# Patient Record
Sex: Female | Born: 1990 | Race: Black or African American | Hispanic: No | Marital: Single | Smoking: Current some day smoker
Health system: Southern US, Community
[De-identification: ages and names within clinical notes are randomized; demographics above are authoritative.]

## PROBLEM LIST (undated history)

## (undated) DIAGNOSIS — L309 Dermatitis, unspecified: Secondary | ICD-10-CM

## (undated) DIAGNOSIS — T7840XA Allergy, unspecified, initial encounter: Secondary | ICD-10-CM

## (undated) DIAGNOSIS — J45909 Unspecified asthma, uncomplicated: Secondary | ICD-10-CM

## (undated) HISTORY — PX: WISDOM TOOTH EXTRACTION: SHX21

## (undated) HISTORY — PX: MOUTH SURGERY: SHX715

## (undated) HISTORY — DX: Allergy, unspecified, initial encounter: T78.40XA

---
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
## 2009-01-04 ENCOUNTER — Emergency Department (HOSPITAL_COMMUNITY): Admission: EM | Admit: 2009-01-04 | Discharge: 2009-01-04 | Payer: Self-pay | Admitting: Emergency Medicine

## 2010-05-22 LAB — CULTURE, ROUTINE-ABSCESS: Gram Stain: NONE SEEN

## 2010-12-15 ENCOUNTER — Inpatient Hospital Stay (INDEPENDENT_AMBULATORY_CARE_PROVIDER_SITE_OTHER)
Admission: RE | Admit: 2010-12-15 | Discharge: 2010-12-15 | Disposition: A | Discharge status: Home / Self Care | Payer: Self-pay | Source: Ambulatory Visit | Attending: Emergency Medicine | Admitting: Emergency Medicine

## 2010-12-15 DIAGNOSIS — T7840XA Allergy, unspecified, initial encounter: Secondary | ICD-10-CM

## 2011-10-28 ENCOUNTER — Encounter (HOSPITAL_COMMUNITY): Payer: Self-pay | Admitting: *Deleted

## 2011-10-28 ENCOUNTER — Emergency Department (INDEPENDENT_AMBULATORY_CARE_PROVIDER_SITE_OTHER)
Admission: EM | Admit: 2011-10-28 | Discharge: 2011-10-28 | Disposition: A | Discharge status: Home / Self Care | Payer: Self-pay | Source: Home / Self Care | Attending: Emergency Medicine | Admitting: Emergency Medicine

## 2011-10-28 DIAGNOSIS — L0291 Cutaneous abscess, unspecified: Secondary | ICD-10-CM

## 2011-10-28 MED ORDER — TRAMADOL HCL 50 MG PO TABS: 100.0000 mg | ORAL_TABLET | Freq: Three times a day (TID) | ORAL | Status: AC | PRN

## 2011-10-28 MED ORDER — DOXYCYCLINE HYCLATE 100 MG PO TABS: 100.0000 mg | ORAL_TABLET | Freq: Two times a day (BID) | ORAL | Status: AC

## 2011-10-28 NOTE — ED Provider Notes (Signed)
Chief Complaint  Patient presents with  . Recurrent Skin Infections    History of Present Illness:    The patient is a 21 year old female with a two-day history of an abscess on her left buttock. This has not been draining any pus. She's had sweats but no chills or fever. She had an abscess to 3 years ago on her right posterior thigh which was positive for MRSA. She has not had any abscesses since then. No history of diabetes. 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  Review of Systems:  Other than noted above, the patient denies any of the following symptoms: Systemic:  No fever, chills or sweats. Skin:  No rash or itching.  PMFSH:  Past medical history, family history, social history, meds, and allergies were reviewed.  No history of diabetes or prior history of abscesses or MRSA.  Physical Exam:   Vital signs:  BP 123/77  Pulse 81  Temp 99.8 F (37.7 C) (Oral)  Resp 19  SpO2 100%  LMP 10/09/2011 Skin:  There is a 1 cm, fluctuant, tender area on the left buttock, in the intergluteal cleft, superior to the anus.  Skin exam was otherwise normal.  No rash. Ext:  Distal pulses were full, patient has full ROM of all joints.    Procedure:  Verbal informed consent was obtained.  The patient was informed of the risks and benefits of the procedure and understands and accepts.  Identity of the patient was verified verbally and by wristband.   The abscess area described above was prepped with Betadine and alcohol and anesthetized with 5 mL of 2% Xylocaine with epinephrine.  Using a #11 scalpel blade, a singe straight incision was made into the area of fluctulence, yielding a large amount of prurulent drainage.  Routine cultures were obtained.  Blunt dissection was used to break up loculations and the resulting wound cavity was packed with 1/4 inch Iodoform gauze.  A sterile pressure dressing was applied.  Assessment:  The encounter diagnosis was Abscess.  Plan:   1.  The  following meds were prescribed:   New Prescriptions   DOXYCYCLINE (VIBRA-TABS) 100 MG TABLET    Take 1 tablet (100 mg total) by mouth 2 (two) times daily.   TRAMADOL (ULTRAM) 50 MG TABLET    Take 2 tablets (100 mg total) by mouth every 8 (eight) hours as needed for pain.   2.  The patient was instructed in symptomatic care and handouts were given. 3.  The patient was instructed to leave the dressing in place and return again in 48 hours for packing removal.   Reuben Likes, MD 10/28/11 2222

## 2011-10-28 NOTE — ED Notes (Signed)
Pt  Reports  Symptoms  Of a  Boil      On  Middle  Of  Buttock  Area  For  Several days    She           Has  History  Of  excema       In  Past  She  Reports   She  Is  Allergic  To  Peanuts  And  Sulfa

## 2011-11-01 LAB — CULTURE, ROUTINE-ABSCESS: Culture: NO GROWTH

## 2012-01-23 ENCOUNTER — Encounter (HOSPITAL_COMMUNITY): Payer: Self-pay | Admitting: Emergency Medicine

## 2012-01-23 ENCOUNTER — Emergency Department (INDEPENDENT_AMBULATORY_CARE_PROVIDER_SITE_OTHER)
Admission: EM | Admit: 2012-01-23 | Discharge: 2012-01-23 | Disposition: A | Discharge status: Home / Self Care | Payer: Self-pay | Source: Home / Self Care | Attending: Emergency Medicine | Admitting: Emergency Medicine

## 2012-01-23 DIAGNOSIS — B340 Adenovirus infection, unspecified: Secondary | ICD-10-CM

## 2012-01-23 DIAGNOSIS — J45909 Unspecified asthma, uncomplicated: Secondary | ICD-10-CM

## 2012-01-23 DIAGNOSIS — L309 Dermatitis, unspecified: Secondary | ICD-10-CM

## 2012-01-23 DIAGNOSIS — L259 Unspecified contact dermatitis, unspecified cause: Secondary | ICD-10-CM

## 2012-01-23 LAB — POCT RAPID STREP A: Streptococcus, Group A Screen (Direct): NEGATIVE

## 2012-01-23 MED ORDER — POLYETHYL GLYCOL-PROPYL GLYCOL 0.4-0.3 % OP SOLN: OPHTHALMIC | Status: DC

## 2012-01-23 MED ORDER — FLUOCINONIDE 0.05 % EX OINT: TOPICAL_OINTMENT | Freq: Two times a day (BID) | CUTANEOUS | Status: DC

## 2012-01-23 MED ORDER — NAPROXEN 500 MG PO TABS: 500.0000 mg | ORAL_TABLET | Freq: Two times a day (BID) | ORAL | Status: DC

## 2012-01-23 MED ORDER — BENZONATATE 200 MG PO CAPS: 200.0000 mg | ORAL_CAPSULE | Freq: Three times a day (TID) | ORAL | Status: DC | PRN

## 2012-01-23 MED ORDER — ALBUTEROL SULFATE HFA 108 (90 BASE) MCG/ACT IN AERS: 1.0000 | INHALATION_SPRAY | Freq: Four times a day (QID) | RESPIRATORY_TRACT | Status: DC | PRN

## 2012-01-23 NOTE — ED Notes (Signed)
Pt c/o cold sx x6 days... Sx include: fever, sore throat, nasal congestion, cough w/yellow sputum, and poss pink eyes... Has tried dayquil/nyquil to alleviate the discomfort... Denies: nauseas, vomiting, diarrhea... She is alert w/no signs of acute distress.

## 2012-01-24 NOTE — ED Provider Notes (Signed)
Chief Complaint  Patient presents with  . URI    History of Present Illness:   The patient is a 21 year old female who has had a one-week history of nasal congestion, head pressure, ear congestion, redness of both eyes with crusting and aching. A nosebleed on the left side, sore throat, cough productive yellow sputum, swollen glands, and she had some fever and chills at the onset of the symptoms. She thought her eyebrows with yellow today and was concerned about that. She has a history of asthma and uses albuterol on an as-needed basis.  Review of Systems:  Other than noted above, the patient denies any of the following symptoms. Systemic:  No fever, chills, sweats, fatigue, myalgias, headache, or anorexia. Eye:  No redness, pain or drainage. ENT:  No earache, ear congestion, nasal congestion, sneezing, rhinorrhea, sinus pressure, sinus pain, post nasal drip, or sore throat. Lungs:  No cough, sputum production, wheezing, shortness of breath, or chest pain. GI:  No abdominal pain, nausea, vomiting, or diarrhea.  PMFSH:  Past medical history, family history, social history, meds, and allergies were reviewed.  Physical Exam:   Vital signs:  BP 128/80  Pulse 69  Temp 98.5 F (36.9 C) (Oral)  Resp 16  SpO2 99%  LMP 01/02/2012 General:  Alert, in no distress. Eye:  Conjunctiva is were injected without any drainage or crusting. Lids were normal. There is no scleral icterus. ENT:  TMs and canals were normal, without erythema or inflammation.  Nasal mucosa was clear and uncongested, without drainage.  Mucous membranes were moist.  Pharynx was clear, without exudate or drainage.  There were no oral ulcerations or lesions. Neck:  Supple, no adenopathy, tenderness or mass. Lungs:  No respiratory distress.  Lungs were clear to auscultation, without wheezes, rales or rhonchi.  Breath sounds were clear and equal bilaterally.  Heart:  Regular rhythm, without gallops, murmers or rubs. Skin:  Clear,  warm, and dry, with extensive eczematous rash.  Labs:   Results for orders placed during the hospital encounter of 01/23/12  POCT RAPID STREP A ([facility] ONLY)      Component Value Range   Streptococcus, Group A Screen (Direct) NEGATIVE  NEGATIVE   Assessment:  The primary encounter diagnosis was Adenovirus infection. Diagnoses of Asthma and Eczema were also pertinent to this visit.  Plan:   1.  The following meds were prescribed:   New Prescriptions   ALBUTEROL (PROVENTIL HFA;VENTOLIN HFA) 108 (90 BASE) MCG/ACT INHALER    Inhale 1-2 puffs into the lungs every 6 (six) hours as needed for wheezing.   BENZONATATE (TESSALON) 200 MG CAPSULE    Take 1 capsule (200 mg total) by mouth 3 (three) times daily as needed for cough.   FLUOCINONIDE OINTMENT (LIDEX) 0.05 %    Apply topically 2 (two) times daily.   NAPROXEN (NAPROSYN) 500 MG TABLET    Take 1 tablet (500 mg total) by mouth 2 (two) times daily.   POLYETHYL GLYCOL-PROPYL GLYCOL (SYSTANE) 0.4-0.3 % SOLN    1 drop in each eye every 3 hours.   2.  The patient was instructed in symptomatic care and handouts were given. 3.  The patient was told to return if becoming worse in any way, if no better in 3 or 4 days, and given some red flag symptoms that would indicate earlier return.   Reuben Likes, MD 01/24/12 Marlyne Beards

## 2012-08-24 ENCOUNTER — Emergency Department (HOSPITAL_COMMUNITY)
Admission: EM | Admit: 2012-08-24 | Discharge: 2012-08-24 | Disposition: A | Discharge status: Home / Self Care | Payer: Self-pay | Attending: Emergency Medicine | Admitting: Emergency Medicine

## 2012-08-24 ENCOUNTER — Encounter (HOSPITAL_COMMUNITY): Payer: Self-pay | Admitting: Emergency Medicine

## 2012-08-24 DIAGNOSIS — Z872 Personal history of diseases of the skin and subcutaneous tissue: Secondary | ICD-10-CM | POA: Insufficient documentation

## 2012-08-24 DIAGNOSIS — R05 Cough: Secondary | ICD-10-CM | POA: Insufficient documentation

## 2012-08-24 DIAGNOSIS — R21 Rash and other nonspecific skin eruption: Secondary | ICD-10-CM | POA: Insufficient documentation

## 2012-08-24 DIAGNOSIS — R0602 Shortness of breath: Secondary | ICD-10-CM | POA: Insufficient documentation

## 2012-08-24 DIAGNOSIS — J45901 Unspecified asthma with (acute) exacerbation: Secondary | ICD-10-CM | POA: Insufficient documentation

## 2012-08-24 DIAGNOSIS — R062 Wheezing: Secondary | ICD-10-CM | POA: Insufficient documentation

## 2012-08-24 DIAGNOSIS — Z79899 Other long term (current) drug therapy: Secondary | ICD-10-CM | POA: Insufficient documentation

## 2012-08-24 DIAGNOSIS — R059 Cough, unspecified: Secondary | ICD-10-CM | POA: Insufficient documentation

## 2012-08-24 HISTORY — DX: Unspecified asthma, uncomplicated: J45.909

## 2012-08-24 HISTORY — DX: Dermatitis, unspecified: L30.9

## 2012-08-24 MED ORDER — AEROCHAMBER PLUS W/MASK MISC
1.0000 | Freq: Once | Status: AC
  Administered 2012-08-24: 1
  Filled 2012-08-24: qty 1

## 2012-08-24 MED ORDER — ALBUTEROL SULFATE (5 MG/ML) 0.5% IN NEBU
5.0000 mg | INHALATION_SOLUTION | Freq: Once | RESPIRATORY_TRACT | Status: AC
  Administered 2012-08-24: 5 mg via RESPIRATORY_TRACT
  Filled 2012-08-24: qty 1

## 2012-08-24 MED ORDER — ALBUTEROL SULFATE HFA 108 (90 BASE) MCG/ACT IN AERS
2.0000 | INHALATION_SPRAY | RESPIRATORY_TRACT | Status: DC | PRN
  Administered 2012-08-24: 2 via RESPIRATORY_TRACT
  Filled 2012-08-24: qty 6.7

## 2012-08-24 NOTE — ED Notes (Signed)
PT. REPORTS ASTHMA ATTACK ONSET LAST NIGHT WITH OCCASIONAL PRODUCTIVE COUGH , PT. RAN OUT OF HER MDI AT HOME LAST NIGHT.

## 2012-08-24 NOTE — ED Provider Notes (Signed)
History    CSN: [id number] Arrival date & time 08/24/12  4098  First MD Initiated Contact with Patient 08/24/12 2111     Chief Complaint  Patient presents with  . Asthma   (Consider location/radiation/quality/duration/timing/severity/associated sxs/prior Treatment) HPI  Patient developed asthma attack last night with wheezing and cough. Treated with NyQuil with partial relief. She reports the cough is productive but "I didn't look at it". She denies fever.nothing makes symptoms better or worse. No other associated symptoms. She is presently asymptomatic since treatment with one nebulized treatment here. Past Medical History  Diagnosis Date  . Asthma   . Eczema    Past Surgical History  Procedure Laterality Date  . Mouth surgery     No family history on file. History  Substance Use Topics  . Smoking status: Never Smoker   . Smokeless tobacco: Not on file  . Alcohol Use: Yes   OB History   Grav Para Term Preterm Abortions TAB SAB Ect Mult Living                 Review of Systems  Constitutional: Negative.   HENT: Negative.   Respiratory: Positive for cough, shortness of breath and wheezing.   Cardiovascular: Negative.   Gastrointestinal: Negative.   Musculoskeletal: Negative.   Skin: Positive for rash.  Neurological: Negative.   Psychiatric/Behavioral: Negative.   All other systems reviewed and are negative.    Allergies  Peanuts and Sulfa antibiotics  Home Medications   Current Outpatient Rx  Name  Route  Sig  Dispense  Refill  . albuterol (PROVENTIL HFA;VENTOLIN HFA) 108 (90 BASE) MCG/ACT inhaler   Inhalation   Inhale 1-2 puffs into the lungs every 6 (six) hours as needed for wheezing.   1 Inhaler   12   . fluocinonide ointment (LIDEX) 0.05 %   Topical   Apply topically 2 (two) times daily.   60 g   12   . hydrocortisone 1 % ointment   Topical   Apply 1 application topically 2 (two) times daily.         . Pseudoeph-Doxylamine-DM-APAP (NYQUIL  PO)   Oral   Take 30 mLs by mouth at bedtime as needed (cold).          BP 115/88  Pulse 92  Temp(Src) 97.6 F (36.4 C) (Oral)  Resp 14  SpO2 100%  LMP 08/18/2012 Physical Exam  Nursing note and vitals reviewed. Constitutional: She appears well-developed and well-nourished.  HENT:  Head: Normocephalic and atraumatic.  Eyes: Conjunctivae are normal. Pupils are equal, round, and reactive to light.  Neck: Neck supple. No tracheal deviation present. No thyromegaly present.  Cardiovascular: Normal rate and regular rhythm.   No murmur heard. Pulmonary/Chest: Effort normal and breath sounds normal. No respiratory distress.  Speaks in paragraphs  Abdominal: Soft. Bowel sounds are normal. She exhibits no distension. There is no tenderness.  Musculoskeletal: Normal range of motion. She exhibits no edema and no tenderness.  Neurological: She is alert. Coordination normal.  Skin: Skin is warm and dry. Rash noted.  Grayish patchy chronic-appearing rash on neck trunk and extremities, suggestive of chronic eczema  Psychiatric: She has a normal mood and affect.    ED Course  Procedures (including critical care time) Labs Reviewed - No data to display No results found. 1. Asthma exacerbation, mild     MDM  Assessment: Patient is a mild asthmatic. She has used steroids in the past. Has had only one ED visit in the  past 12 months for asthma. No hospitalizations since early childhood for asthma. Plan albuterol inhaler with spacer to go he is 2 puffs every 4 hours when necessary shortness of breath return if new more than every 4 hours. Referral [facility] Diagnosis exacerbation of asthma  Doug Sou, MD 08/24/12 2137

## 2012-09-23 ENCOUNTER — Emergency Department (HOSPITAL_COMMUNITY)
Admission: EM | Admit: 2012-09-23 | Discharge: 2012-09-23 | Disposition: A | Discharge status: Home / Self Care | Payer: Self-pay | Attending: Emergency Medicine | Admitting: Emergency Medicine

## 2012-09-23 ENCOUNTER — Encounter (HOSPITAL_COMMUNITY): Payer: Self-pay | Admitting: Emergency Medicine

## 2012-09-23 DIAGNOSIS — Z79899 Other long term (current) drug therapy: Secondary | ICD-10-CM | POA: Insufficient documentation

## 2012-09-23 DIAGNOSIS — L309 Dermatitis, unspecified: Secondary | ICD-10-CM

## 2012-09-23 DIAGNOSIS — R21 Rash and other nonspecific skin eruption: Secondary | ICD-10-CM | POA: Insufficient documentation

## 2012-09-23 DIAGNOSIS — J45909 Unspecified asthma, uncomplicated: Secondary | ICD-10-CM | POA: Insufficient documentation

## 2012-09-23 DIAGNOSIS — L259 Unspecified contact dermatitis, unspecified cause: Secondary | ICD-10-CM | POA: Insufficient documentation

## 2012-09-23 DIAGNOSIS — L299 Pruritus, unspecified: Secondary | ICD-10-CM | POA: Insufficient documentation

## 2012-09-23 MED ORDER — FLUOCINONIDE-E 0.05 % EX CREA: TOPICAL_CREAM | Freq: Two times a day (BID) | CUTANEOUS | Status: DC

## 2012-09-23 MED ORDER — FLUOCINONIDE 0.05 % EX CREA: TOPICAL_CREAM | Freq: Two times a day (BID) | CUTANEOUS | Status: DC

## 2012-09-23 NOTE — ED Provider Notes (Signed)
  CSN: [id number]     Arrival date & time 09/23/12  1057 History     First MD Initiated Contact with Patient 09/23/12 1224     Chief Complaint  Patient presents with  . Rash   (Consider location/radiation/quality/duration/timing/severity/associated sxs/prior Treatment) HPI Comments: Patient with a history of Eczema presents with a chief complaint of rash.  Rash is located on both arms, both legs, and neck.  Rash has been present for months, but has been worsening.  She has been on Lidex in the past for her Eczema, which she reports has helped.  However, she has been off of the medication for the past 5 months.  She currently does not have a PCP.  Rash is scaly and does itch. Rash not painful.  She has not been putting anything on it.  She denies any shortness of breath or swelling of the lips, tongue, or throat.  No fever or chills.  The history is provided by the patient.    Past Medical History  Diagnosis Date  . Asthma   . Eczema    Past Surgical History  Procedure Laterality Date  . Mouth surgery     No family history on file. History  Substance Use Topics  . Smoking status: Never Smoker   . Smokeless tobacco: Not on file  . Alcohol Use: Yes   OB History   Grav Para Term Preterm Abortions TAB SAB Ect Mult Living                 Review of Systems  Skin: Positive for rash.  All other systems reviewed and are negative.    Allergies  Peanuts and Sulfa antibiotics  Home Medications   Current Outpatient Rx  Name  Route  Sig  Dispense  Refill  . albuterol (PROVENTIL HFA;VENTOLIN HFA) 108 (90 BASE) MCG/ACT inhaler   Inhalation   Inhale 2 puffs into the lungs every 6 (six) hours as needed for wheezing.          BP 146/87  Pulse 108  Temp(Src) 98 F (36.7 C)  Resp 16  SpO2 99%  LMP 08/18/2012 Physical Exam  Nursing note and vitals reviewed. Constitutional: She appears well-developed and well-nourished.  HENT:  Head: Normocephalic and atraumatic.   Mouth/Throat: Oropharynx is clear and moist.  No swelling of the lips, tongue, or throat.  Cardiovascular: Normal rate, regular rhythm and normal heart sounds.   Pulmonary/Chest: Effort normal and breath sounds normal. She has no wheezes.  Neurological: She is alert.  Skin: Skin is warm and dry.  Excoriated scaly rash of both arms, both legs, and neck.    Psychiatric: She has a normal mood and affect.    ED Course   Procedures (including critical care time)  Labs Reviewed - No data to display No results found. No diagnosis found.  MDM  Patient with a history of Eczema presents with a rash of both arms and neck.  Rash consistent with Eczema.  Patient has been off of her Eczema medications for the past 4 months.  Patient given the Lidex that she has been on in the past.  Patient given Lidex and instructed to follow up with Dermatology.  Pascal Lux Saxman, PA-C 09/24/12 [phone]

## 2012-09-23 NOTE — ED Notes (Addendum)
All over eczema that has gotten worse all over itching

## 2012-09-29 NOTE — ED Provider Notes (Signed)
Medical screening examination/treatment/procedure(s) were performed by non-physician practitioner and as supervising physician I was immediately available for consultation/collaboration.   Charles B. Bernette Mayers, MD 09/29/12 1115

## 2013-02-03 ENCOUNTER — Emergency Department (HOSPITAL_COMMUNITY)
Admission: EM | Admit: 2013-02-03 | Discharge: 2013-02-03 | Disposition: A | Discharge status: Home / Self Care | Payer: Self-pay | Attending: Emergency Medicine | Admitting: Emergency Medicine

## 2013-02-03 ENCOUNTER — Encounter (HOSPITAL_COMMUNITY): Payer: Self-pay | Admitting: Emergency Medicine

## 2013-02-03 DIAGNOSIS — IMO0002 Reserved for concepts with insufficient information to code with codable children: Secondary | ICD-10-CM | POA: Insufficient documentation

## 2013-02-03 DIAGNOSIS — J45901 Unspecified asthma with (acute) exacerbation: Secondary | ICD-10-CM | POA: Insufficient documentation

## 2013-02-03 DIAGNOSIS — Z872 Personal history of diseases of the skin and subcutaneous tissue: Secondary | ICD-10-CM | POA: Insufficient documentation

## 2013-02-03 DIAGNOSIS — Z79899 Other long term (current) drug therapy: Secondary | ICD-10-CM | POA: Insufficient documentation

## 2013-02-03 MED ORDER — ALBUTEROL SULFATE HFA 108 (90 BASE) MCG/ACT IN AERS
2.0000 | INHALATION_SPRAY | Freq: Once | RESPIRATORY_TRACT | Status: AC
  Administered 2013-02-03: 2 via RESPIRATORY_TRACT
  Filled 2013-02-03: qty 6.7

## 2013-02-03 MED ORDER — PREDNISONE 20 MG PO TABS: 40.0000 mg | ORAL_TABLET | Freq: Every day | ORAL | Status: DC

## 2013-02-03 MED ORDER — PREDNISONE 20 MG PO TABS
60.0000 mg | ORAL_TABLET | Freq: Once | ORAL | Status: AC
  Administered 2013-02-03: 60 mg via ORAL
  Filled 2013-02-03: qty 3

## 2013-02-03 MED ORDER — IPRATROPIUM BROMIDE 0.02 % IN SOLN: 0.5000 mg | Freq: Once | RESPIRATORY_TRACT | Status: DC

## 2013-02-03 MED ORDER — ALBUTEROL SULFATE (5 MG/ML) 0.5% IN NEBU: 2.5000 mg | INHALATION_SOLUTION | Freq: Once | RESPIRATORY_TRACT | Status: DC

## 2013-02-03 MED ORDER — FLUOCINONIDE 0.05 % EX CREA: TOPICAL_CREAM | Freq: Two times a day (BID) | CUTANEOUS | Status: DC

## 2013-02-03 NOTE — ED Provider Notes (Signed)
CSN: [id number]     Arrival date & time 02/03/13  0447 History   First MD Initiated Contact with Patient 02/03/13 0459     Chief Complaint  Patient presents with  . Asthma   (Consider location/radiation/quality/duration/timing/severity/associated sxs/prior Treatment) HPI Comments: Patient is a 22 year old female with a history of asthma and asked him who presents this evening for shortness of breath. Patient states that shortness of breath has been intermittent gradually worsening over the last 2 days. Patient states that she usually gets relief from her albuterol inhaler but she has been out of this for the last 2 days. Symptoms worsen with prolonged exertion and mildly improved with rest and when chewing mint gum. Patient endorses sporadic dry cough associated with symptoms. She denies associated fever, lightheadedness, dizziness, loss of consciousness, hemoptysis, chest pain, nausea or vomiting, numbness/tingling, and weakness. Patient endorses a history of hospitalizations secondary to asthma, but "not for many years". She denies history of intubations secondary to asthma.  Patient is a 22 y.o. female presenting with asthma. The history is provided by the patient. No language interpreter was used.  Asthma Associated symptoms include coughing.    Past Medical History  Diagnosis Date  . Asthma   . Eczema    Past Surgical History  Procedure Laterality Date  . Mouth surgery     No family history on file. History  Substance Use Topics  . Smoking status: Never Smoker   . Smokeless tobacco: Not on file  . Alcohol Use: Yes   OB History   Grav Para Term Preterm Abortions TAB SAB Ect Mult Living                 Review of Systems  Respiratory: Positive for cough and shortness of breath.   All other systems reviewed and are negative.    Allergies  Peanuts and Sulfa antibiotics  Home Medications   Current Outpatient Rx  Name  Route  Sig  Dispense  Refill  . albuterol  (PROVENTIL HFA;VENTOLIN HFA) 108 (90 BASE) MCG/ACT inhaler   Inhalation   Inhale 2 puffs into the lungs every 6 (six) hours as needed for wheezing.         . Pseudoephedrine-DM-GG-APAP (TYLENOL COLD SEVERE CONGESTION PO)   Oral   Take 1 capsule by mouth every 6 (six) hours as needed.         . fluocinonide cream (LIDEX) 0.05 %   Topical   Apply topically 2 (two) times daily.   30 g   0   . predniSONE (DELTASONE) 20 MG tablet   Oral   Take 2 tablets (40 mg total) by mouth daily.   10 tablet   0    BP 144/84  Pulse 101  Temp(Src) 98.1 F (36.7 C) (Oral)  Resp 18  Ht 5\' 6"  (1.676 m)  Wt 160 lb (72.576 kg)  BMI 25.84 kg/m2  SpO2 96%  LMP 01/18/2013  Physical Exam  Nursing note and vitals reviewed. Constitutional: She is oriented to person, place, and time. She appears well-developed and well-nourished. No distress.  HENT:  Head: Normocephalic and atraumatic.  Mouth/Throat: Oropharynx is clear and moist. No oropharyngeal exudate.  Eyes: Conjunctivae and EOM are normal. Pupils are equal, round, and reactive to light. No scleral icterus.  Neck: Normal range of motion. Neck supple.  Cardiovascular: Normal rate, regular rhythm, normal heart sounds and intact distal pulses.   HR 93bpm  Pulmonary/Chest: Effort normal and breath sounds normal. No stridor. No respiratory distress.  She has no wheezes. She has no rales.  No retractions or accessory muscle use; symmetric chest expansion. Patient in no acute respiratory distress.  Abdominal: Soft. She exhibits no distension. There is no tenderness.  Musculoskeletal: Normal range of motion.  Neurological: She is alert and oriented to person, place, and time.  Skin: Skin is warm and dry. She is not diaphoretic. No erythema. No pallor.  Eczematous rash on b/l extremities, neck, and face. Pruritic. Non-weeping.  Psychiatric: She has a normal mood and affect. Her behavior is normal.    ED Course  Procedures (including critical care  time) Labs Review Labs Reviewed - No data to display Imaging Review No results found.  EKG Interpretation   None       MDM   1. Asthma exacerbation, mild    Uncomplicated asthma exacerbation. Patient with history of asthma and states she has been out of her inhaler and, therefore, unable to manage symptoms at home. Lungs clear to auscultation bilaterally in patient without retractions or accessory muscle use on physical exam. Heart regular rate and rhythm. Patient treated in ED with albuterol and Atrovent nebulizer as well as oral prednisone with improvement in symptoms. Patient endorses significant improvement and ambulates without hypoxia in ED. She is stable for discharge with albuterol inhaler and five-day course of prednisone. Return precautions discussed and patient agreeable to plan with no unaddressed concerns.   Filed Vitals:   02/03/13 0447 02/03/13 0458 02/03/13 0558 02/03/13 0616  BP:  144/84  136/82  Pulse:  101  77  Temp:  98.1 F (36.7 C)    TempSrc:  Oral    Resp:  18  18  Height:  5\' 6"  (1.676 m)    Weight:  160 lb (72.576 kg)    SpO2: 96% 100% 96% 100%     Antony Madura, PA-C 02/12/13 1933

## 2013-02-03 NOTE — ED Notes (Signed)
Bed: AV40 Expected date:  Expected time:  Means of arrival:  Comments: EMS asthma, resp distress

## 2013-02-03 NOTE — ED Notes (Signed)
Pt verbalized understanding of MDI and d/c instructions

## 2013-02-03 NOTE — ED Notes (Signed)
Pt arrived via EMS. Pt trasported from home with asthma exacerbation. Pt out of MDI x 2days. 1 albuterol neb tx given enroute.

## 2013-02-04 NOTE — ED Provider Notes (Signed)
Medical screening examination/treatment/procedure(s) were performed by non-physician practitioner and as supervising physician I was immediately available for consultation/collaboration.  Arilyn Brierley M Sherice Ijames, MD 02/04/13 2049 

## 2013-02-13 NOTE — ED Provider Notes (Signed)
Medical screening examination/treatment/procedure(s) were performed by non-physician practitioner and as supervising physician I was immediately available for consultation/collaboration.   Hurman Horn, MD 02/13/13 [phone]

## 2013-03-09 ENCOUNTER — Encounter (HOSPITAL_COMMUNITY): Payer: Self-pay | Admitting: Emergency Medicine

## 2013-03-09 ENCOUNTER — Emergency Department (HOSPITAL_COMMUNITY)
Admission: EM | Admit: 2013-03-09 | Discharge: 2013-03-09 | Disposition: A | Discharge status: Home / Self Care | Payer: Self-pay | Attending: Emergency Medicine | Admitting: Emergency Medicine

## 2013-03-09 DIAGNOSIS — L259 Unspecified contact dermatitis, unspecified cause: Secondary | ICD-10-CM | POA: Insufficient documentation

## 2013-03-09 DIAGNOSIS — J45901 Unspecified asthma with (acute) exacerbation: Secondary | ICD-10-CM | POA: Insufficient documentation

## 2013-03-09 DIAGNOSIS — Z79899 Other long term (current) drug therapy: Secondary | ICD-10-CM | POA: Insufficient documentation

## 2013-03-09 DIAGNOSIS — J45909 Unspecified asthma, uncomplicated: Secondary | ICD-10-CM

## 2013-03-09 MED ORDER — ALBUTEROL SULFATE (2.5 MG/3ML) 0.083% IN NEBU
5.0000 mg | INHALATION_SOLUTION | Freq: Once | RESPIRATORY_TRACT | Status: AC
  Administered 2013-03-09: 5 mg via RESPIRATORY_TRACT
  Filled 2013-03-09: qty 6

## 2013-03-09 MED ORDER — ALBUTEROL SULFATE HFA 108 (90 BASE) MCG/ACT IN AERS
2.0000 | INHALATION_SPRAY | Freq: Once | RESPIRATORY_TRACT | Status: AC
  Administered 2013-03-09: 2 via RESPIRATORY_TRACT
  Filled 2013-03-09: qty 6.7

## 2013-03-09 MED ORDER — ALBUTEROL SULFATE HFA 108 (90 BASE) MCG/ACT IN AERS: 1.0000 | INHALATION_SPRAY | RESPIRATORY_TRACT | Status: DC | PRN

## 2013-03-09 NOTE — ED Notes (Signed)
Pt. reports asthma attack this evening , pt.stated she ran out of her MDI at home . Denies fever or chills.

## 2013-03-09 NOTE — Discharge Instructions (Signed)
Asthma, Adult °Asthma is a recurring condition in which the airways tighten and narrow. Asthma can make it difficult to breathe. It can cause coughing, wheezing, and shortness of breath. Asthma episodes (also called asthma attacks) range from minor to life-threatening. Asthma cannot be cured, but medicines and lifestyle changes can help control it. °CAUSES °Asthma is believed to be caused by inherited (genetic) and environmental factors, but its exact cause is unknown. Asthma may be triggered by allergens, lung infections, or irritants in the air. Asthma triggers are different for each person. Common triggers include:  °· Animal dander. °· Dust mites. °· Cockroaches. °· Pollen from trees or grass. °· Mold. °· Smoke. °· Air pollutants such as dust, household cleaners, hair sprays, aerosol sprays, paint fumes, strong chemicals, or strong odors. °· Cold air, weather changes, and winds (which increase molds and pollens in the air). °· Strong emotional expressions such as crying or laughing hard. °· Stress. °· Certain medicines (such as aspirin) or types of drugs (such as beta-blockers). °· Sulfites in foods and drinks. Foods and drinks that may contain sulfites include dried fruit, potato chips, and sparkling grape juice. °· Infections or inflammatory conditions such as the flu, a cold, or an inflammation of the nasal membranes (rhinitis). °· Gastroesophageal reflux disease (GERD). °· Exercise or strenuous activity. °SYMPTOMS °Symptoms may occur immediately after asthma is triggered or many hours later. Symptoms include: °· Wheezing. °· Excessive nighttime or early morning coughing. °· Frequent or severe coughing with a common cold. °· Chest tightness. °· Shortness of breath. °DIAGNOSIS  °The diagnosis of asthma is made by a review of your medical history and a physical exam. Tests may also be performed. These may include: °· Lung function studies. These tests show how much air you breath in and out. °· Allergy  tests. °· Imaging tests such as X-rays. °TREATMENT  °Asthma cannot be cured, but it can usually be controlled. Treatment involves identifying and avoiding your asthma triggers. It also involves medicines. There are 2 classes of medicine used for asthma treatment:  °· Controller medicines. These prevent asthma symptoms from occurring. They are usually taken every day. °· Reliever or rescue medicines. These quickly relieve asthma symptoms. They are used as needed and provide short-term relief. °Your health care provider will help you create an asthma action plan. An asthma action plan is a written plan for managing and treating your asthma attacks. It includes a list of your asthma triggers and how they may be avoided. It also includes information on when medicines should be taken and when their dosage should be changed. An action plan may also involve the use of a device called a peak flow meter. A peak flow meter measures how well the lungs are working. It helps you monitor your condition. °HOME CARE INSTRUCTIONS  °· Take medicine as directed by your health care provider. Speak with your health care provider if you have questions about how or when to take the medicines. °· Use a peak flow meter as directed by your health care provider. Record and keep track of readings. °· Understand and use the action plan to help minimize or stop an asthma attack without needing to seek medical care. °· Control your home environment in the following ways to help prevent asthma attacks: °· Do not smoke. Avoid being exposed to secondhand smoke. °· Change your heating and air conditioning filter regularly. °· Limit your use of fireplaces and wood stoves. °· Get rid of pests (such as roaches and   mice) and their droppings. °· Throw away plants if you see mold on them. °· Clean your floors and dust regularly. Use unscented cleaning products. °· Try to have someone else vacuum for you regularly. Stay out of rooms while they are being  vacuumed and for a short while afterward. If you vacuum, use a dust mask from a hardware store, a double-layered or microfilter vacuum cleaner bag, or a vacuum cleaner with a HEPA filter. °· Replace carpet with wood, tile, or vinyl flooring. Carpet can trap dander and dust. °· Use allergy-proof pillows, mattress covers, and box spring covers. °· Wash bed sheets and blankets every week in hot water and dry them in a dryer. °· Use blankets that are made of polyester or cotton. °· Clean bathrooms and kitchens with bleach. If possible, have someone repaint the walls in these rooms with mold-resistant paint. Keep out of the rooms that are being cleaned and painted. °· Wash hands frequently. °SEEK MEDICAL CARE IF:  °· You have wheezing, shortness of breath, or a cough even if taking medicine to prevent attacks. °· The colored mucus you cough up (sputum) is thicker than usual. °· Your sputum changes from clear or white to yellow, green, gray, or bloody. °· You have any problems that may be related to the medicines you are taking (such as a rash, itching, swelling, or trouble breathing). °· You are using a reliever medicine more than 2 3 times per week. °· Your peak flow is still at 50 79% of you personal best after following your action plan for 1 hour. °SEEK IMMEDIATE MEDICAL CARE IF:  °· You seem to be getting worse and are unresponsive to treatment during an asthma attack. °· You are short of breath even at rest. °· You get short of breath when doing very little physical activity. °· You have difficulty eating, drinking, or talking due to asthma symptoms. °· You develop chest pain. °· You develop a fast heartbeat. °· You have a bluish color to your lips or fingernails. °· You are lightheaded, dizzy, or faint. °· Your peak flow is less than 50% of your personal best. °· You have a fever or persistent symptoms for more than 2 3 days. °· You have a fever and symptoms suddenly get worse. °MAKE SURE YOU:  °· Understand these  instructions. °· Will watch your condition. °· Will get help right away if you are not doing well or get worse. °Document Released: 02/03/2005 Document Revised: 10/06/2012 Document Reviewed: 09/02/2012 °ExitCare® Patient Information ©2014 [employer]. ° ° °Emergency Department Resource Guide °1) Find a Doctor and Pay Out of Pocket °Although you won't have to find out who is covered by your insurance plan, it is a good idea to ask around and get recommendations. You will then need to call the office and see if the doctor you have chosen will accept you as a new patient and what types of options they offer for patients who are self-pay. Some doctors offer discounts or will set up payment plans for their patients who do not have insurance, but you will need to ask so you aren't surprised when you get to your appointment. ° °2) Contact Your Local Health Department °Not all health departments have doctors that can see patients for sick visits, but many do, so it is worth a call to see if yours does. If you don't know where your local health department is, you can check in your phone book. The CDC also has a   tool to help you locate your state's health department, and many state websites also have listings of all of their local health departments.  3) Find a [facility] If your illness is not likely to be very severe or complicated, you may want to try a walk in clinic. These are popping up all over the country in pharmacies, drugstores, and shopping centers. They're usually staffed by nurse practitioners or physician assistants that have been trained to treat common illnesses and complaints. They're usually fairly quick and inexpensive. However, if you have serious medical issues or chronic medical problems, these are probably not your best option.  No Primary Care Doctor: - Call Health Connect at  [phone] - they can help you locate a primary care doctor that  accepts your insurance, provides certain services,  etc. - Physician Referral Service- [phone]  Chronic Pain Problems: Organization         Address  Phone   Notes  [facility]  [phone] Patients need to be referred by their primary care doctor.   Medication Assistance: Organization         Address  Phone   Notes  [facility] Medication [facility] [place], [place] [address] [phone] --Must be a resident of [facility] -- Must have NO insurance coverage whatsoever (no Medicaid/ Medicare, etc.) -- The pt. MUST have a primary care doctor that directs their care regularly and follows them in the community   [facility]ist  [phone]   [employer]  [phone]    Agencies that provide inexpensive medical care: Organization         Address  Phone   Notes  [place]  [phone]   [facility]    [phone]   [facility] [place], [place] [address] [phone]   [place] [address], [place] [phone]   Planned Parenthood    [phone]   [facility]    [phone]   [place] and [place], [place] Phone:  [phone], Fax:  [phone] Hours of Operation:  9 am - 6 pm, M-F.  Also accepts Medicaid/Medicare and self-pay.  [facility] for [place], [address], [place] Phone: [phone], Fax: [phone]. Hours of Operation:  8:30 am - 5:30 pm, M-F.  Also accepts Medicaid and self-pay.  [facility] [place] [address] [place] Phone: [phone]   [place], [place], [place] [phone], Ext. [phone] Mondays & Thursdays: 7-9 AM.  First 15 patients are seen on a first come, first serve basis.    [place] Providers:  Organization         Address  Phone   Notes  [facility] [address], [place] [phone] Also accepts self-pay patients.  [facility] [id number] [place], [place]  [phone]   [place], [address], [place] [phone]   [facility] [address] [place] [phone]   Lucianne Lei [address], [place]   [phone] Only accepts [place] Access [place] patients after they have their name applied to their card.   Self-Pay (no insurance) in [place]:  Organization         Address  Phone   Notes  Sickle Cell Patients, [facility] [place] [phone]   [facility] [place] [phone]   [facility] [place]  [place], [address], [place] [phone]   [facility]/Dr. Osei-Bonsu  [address], [place] or [address], [place] [phone] Phone number for both [place] and [place] locations is the same.  [facility] [address] [place] [phone]   [facility] [address], [place] or [address] [phone]   [facility] [address] [place] [phone], phone; [phone], fax Sees patients 1st and 3rd Saturday of every month.  Must not qualify for public or private insurance (i.e. Medicaid, Medicare, [place] Health Choice, Veterans' Benefits)  Household income should be no more than 200% of the poverty level The clinic cannot treat you if you are pregnant or think you are pregnant  Sexually transmitted diseases are not treated at the clinic.    Dental Care: Organization         Address  Phone  Notes  [facility] Department of [facility] [place] [phone] Accepts children up to  age 68 who are enrolled in [place] or [place]; pregnant women with a Medicaid card; and children who have applied for Medicaid or [place] Health Choice, but were declined, whose parents can pay a reduced fee at time of service.  [facility] Department of [facility]  [address], [place] [phone] Accepts children up to age 41 who are enrolled in [place] or [place]; pregnant women with a Medicaid card; and children who have applied for Medicaid or [place] Health Choice, but were declined, whose parents can pay a reduced fee at time of service.  [place] Adult Dental Access PROGRAM  [place] [phone] Patients are seen by appointment only. Walk-ins are not accepted. [place] will see patients 7 years of age and older. Monday - Tuesday (8am-5pm) Most Wednesdays (8:30-5pm) $30 per visit, cash only  [facility] Adult [facility] Access PROGRAM  [address], [place] [facility] [phone] Patients are seen by appointment only. Walk-ins are not accepted. [place] will see patients 87 years of age and older. One Wednesday Evening (Monthly: Volunteer Based).  $30 per visit, cash only  [place]  [phone] for adults; Children under age 74, call Graduate Pediatric Dentistry at [phone]. Children aged 51-14, please call [phone] to request a pediatric application.  Dental services are provided in all areas of dental care including fillings, crowns and bridges, complete and partial dentures, implants, gum treatment, root canals, and extractions. Preventive care is also provided. Treatment is provided to both adults and children. Patients are selected via a lottery and there is often a waiting list.   [facility] [address] [place]  [phone] [email or web address]   [facility] [address] [place], [place] [phone], Ext. [phone] Second and Fourth Thursday of  each month, opens at 6:30 AM; Clinic ends at 9 AM.  Patients are seen on a first-come first-served basis, and a limited number are seen during each clinic.  [facility]  [address] Hillard Danker [place], [place] [phone]   Eligibility Requirements You must have lived in [place], [place], or [place] counties for at least the last three months.   You cannot be eligible for state or federal sponsored [employer], including [employer], [place], or [employer].   You generally cannot be eligible for healthcare insurance through your employer.    How to apply: Eligibility screenings are held every Tuesday and Wednesday afternoon from 1:00 pm until 4:00 pm. You do not need an appointment for the interview!  [facility] [address], [place], [place]   [place]  [place] Department  [place]  [phone]    Behavioral Health Resources in the Community: Intensive Outpatient Programs Organization         Address  Phone  Notes  Colma [place]. [address] [place], [place] [phone]   [facility] Outpatient [address] [place], [place]   ADS: Alcohol & Drug Svcs [address] [place], [employer]   [place] [address]  [employer], [place] or [phone]   Substance Abuse Resources Organization         Address  Phone  Notes  Alcohol and Drug Services  [phone]   [place]  [phone]   [facility] [place]   Chinita Pester  [phone]   Residential & Outpatient Substance Abuse Program  [phone]   Psychological Services Organization         Address  Phone  Notes  [facility] [place]  [place]  [phone]   [place] [address],  [place] or [phone]    Mobile Crisis Teams Organization         Address  Phone  Notes  [facility], Mobile Crisis Care Unit  [phone]   [facility]  [address] [place], [place]   [place] [address] [place] [phone]    Self-Help/Support Groups Organization         Address  Phone             Notes  [place]. of [place] - variety of support groups  [place] Call for more information  Narcotics Anonymous (NA), [facility] [address], [employer] [place]  2 meetings at this location   Special educational needs teacher         Address  Phone  Notes  [facility] [place],    [place]  [phone]   [facility]  [address], [place] [id number], [place], [place]   [place], [place] [phone] Admissions: 8am-3pm M-F  Incentives Substance [place] [address],    [place], [place] [phone]   [facility] [address] Jadene Pierini [place], [place]   [facility] [address],  [place], [place]   [facility] Programs - Intensive Outpatient [address] Krist[address], [place], [place]   [facility] ([place].,  [place], [place] or [phone]   [facility] (RTS) [address], [place], [place] Accepts Medicaid  [facility] [place] [address]  [place] [phone] Substance Abuse/Addiction Treatment  [facility] Organization         Address  Phone  Notes  [facility]  [phone]   Domenic Schwab, PhD [address] Arlis Porta [place], [place]   [phone] or [phone]   [place], [place] [phone]     [address], [place], [place] [phone] Insurance/Medicaid/sponsorship through [facility] and Families [address] [place]                                    [place], [place] [phone] [place] [address], [place] [phone]    Dr. Adele Schilder  [phone]   [facility] Dept. 1) [address],  2) [place] 3)  [place] [address], [place] [phone]  [phone]   [place] [phone] or [phone] (After Hours)

## 2013-03-09 NOTE — ED Provider Notes (Signed)
CSN: [id number]     Arrival date & time 03/09/13  0008 History   First MD Initiated Contact with Patient 03/09/13 0425     Chief Complaint  Patient presents with  . Asthma   (Consider location/radiation/quality/duration/timing/severity/associated sxs/prior Treatment) Patient is a 23 y.o. female presenting with asthma.  Asthma This is a recurrent problem. Episode onset: 6 hours ago. The problem occurs constantly. The problem has been resolved. Associated symptoms include chest pain (tightness) and shortness of breath. Pertinent negatives include no abdominal pain. Associated symptoms comments: No URI symptoms, no fevers.  . Nothing aggravates the symptoms. Nothing relieves the symptoms. Treatments tried: nebulizer treatment. The treatment provided significant relief.    Past Medical History  Diagnosis Date  . Asthma   . Eczema    Past Surgical History  Procedure Laterality Date  . Mouth surgery     No family history on file. History  Substance Use Topics  . Smoking status: Never Smoker   . Smokeless tobacco: Not on file  . Alcohol Use: Yes   OB History   Grav Para Term Preterm Abortions TAB SAB Ect Mult Living                 Review of Systems  Constitutional: Negative for fever.  HENT: Negative for congestion.   Respiratory: Positive for cough and shortness of breath.   Cardiovascular: Positive for chest pain (tightness).  Gastrointestinal: Negative for nausea, vomiting, abdominal pain and diarrhea.  All other systems reviewed and are negative.    Allergies  Peanuts and Sulfa antibiotics  Home Medications   Current Outpatient Rx  Name  Route  Sig  Dispense  Refill  . albuterol (PROVENTIL HFA;VENTOLIN HFA) 108 (90 BASE) MCG/ACT inhaler   Inhalation   Inhale 2 puffs into the lungs every 6 (six) hours as needed for wheezing.         . fluocinonide cream (LIDEX) 0.05 %   Topical   Apply topically 2 (two) times daily.   30 g   0   . Pseudoephedrine-DM-GG-APAP  (TYLENOL COLD SEVERE CONGESTION PO)   Oral   Take 1 capsule by mouth every 6 (six) hours as needed (cold).           BP 121/86  Pulse 78  Temp(Src) 97.8 F (36.6 C) (Oral)  Resp 12  Wt 180 lb 5 oz (81.789 kg)  SpO2 100%  LMP 02/11/2013 Physical Exam  Nursing note and vitals reviewed. Constitutional: She is oriented to person, place, and time. She appears well-developed and well-nourished. No distress.  HENT:  Head: Normocephalic and atraumatic.  Mouth/Throat: Oropharynx is clear and moist.  Eyes: Conjunctivae are normal. Pupils are equal, round, and reactive to light. No scleral icterus.  Neck: Neck supple.  Cardiovascular: Normal rate, regular rhythm, normal heart sounds and intact distal pulses.   No murmur heard. Pulmonary/Chest: Effort normal and breath sounds normal. No stridor. No respiratory distress. She has no wheezes. She has no rales.  Abdominal: Soft. Bowel sounds are normal. She exhibits no distension. There is no tenderness.  Musculoskeletal: Normal range of motion.  Neurological: She is alert and oriented to person, place, and time.  Skin: Skin is warm and dry.  eczema  Psychiatric: She has a normal mood and affect. Her behavior is normal.    ED Course  Procedures (including critical care time) Labs Review Labs Reviewed - No data to display Imaging Review No results found.  EKG Interpretation   None  MDM   1. Asthma    23 yo female with hx of asthma presenting with wheezing and SOB.  No fevers or URI symptoms.  She does not have an inhaler at home.  Her symptoms completely resolved after neb treatment given prior to my evaluation.  No wheezing or decreased air movement on my exam.  Well appearing and normal respiratory effort.  Plan dc with inhaler and outpatient follow up.     Candyce ChurnJohn David Mirelle Biskup, MD 03/09/13 [phone]

## 2013-08-13 ENCOUNTER — Encounter (HOSPITAL_COMMUNITY): Payer: Self-pay | Admitting: Emergency Medicine

## 2013-08-13 ENCOUNTER — Emergency Department (HOSPITAL_COMMUNITY)
Admission: EM | Admit: 2013-08-13 | Discharge: 2013-08-14 | Disposition: A | Discharge status: Home / Self Care | Payer: 59 | Attending: Emergency Medicine | Admitting: Emergency Medicine

## 2013-08-13 DIAGNOSIS — J45901 Unspecified asthma with (acute) exacerbation: Secondary | ICD-10-CM | POA: Insufficient documentation

## 2013-08-13 DIAGNOSIS — Z91010 Allergy to peanuts: Secondary | ICD-10-CM | POA: Insufficient documentation

## 2013-08-13 DIAGNOSIS — L259 Unspecified contact dermatitis, unspecified cause: Secondary | ICD-10-CM | POA: Insufficient documentation

## 2013-08-13 DIAGNOSIS — Z882 Allergy status to sulfonamides status: Secondary | ICD-10-CM | POA: Insufficient documentation

## 2013-08-13 DIAGNOSIS — Z79899 Other long term (current) drug therapy: Secondary | ICD-10-CM | POA: Insufficient documentation

## 2013-08-13 MED ORDER — ALBUTEROL SULFATE (2.5 MG/3ML) 0.083% IN NEBU
5.0000 mg | INHALATION_SOLUTION | Freq: Once | RESPIRATORY_TRACT | Status: AC
  Administered 2013-08-13: 5 mg via RESPIRATORY_TRACT
  Filled 2013-08-13: qty 6

## 2013-08-13 MED ORDER — PREDNISONE 20 MG PO TABS: 40.0000 mg | ORAL_TABLET | Freq: Every day | ORAL | Status: DC

## 2013-08-13 MED ORDER — ALBUTEROL SULFATE HFA 108 (90 BASE) MCG/ACT IN AERS
2.0000 | INHALATION_SPRAY | RESPIRATORY_TRACT | Status: DC | PRN
  Administered 2013-08-13: 2 via RESPIRATORY_TRACT
  Filled 2013-08-13: qty 6.7

## 2013-08-13 NOTE — ED Notes (Addendum)
The patient said she began having astma exacerbation about 1900.  The patient tried to use her inhaler and it was empty.  The patient says her lungs hurt but not other pain.  She is complaining of SOB.  The patient says the asthma attack is due to the air being so "hot" and "thick".

## 2013-08-13 NOTE — ED Provider Notes (Signed)
CSN: [id number]     Arrival date & time 08/13/13  2305 History  This chart was scribed for non-physician practitioner working with Brandt LoosenJulie Manly, MD by Elveria Risingimelie Horne, ED Scribe. This patient was seen in room TR06C/TR06C and the patient's care was started at 11:44 PM.   Chief Complaint  Patient presents with  . Asthma    The patient said she began having astma exacerbation about 1900.  The patient tried to use her inhaler and it was empty.       The history is provided by the patient. No language interpreter was used.   HPI Comments: Connie Payne is a 23 y.o. female with history of asthma who presents to the Emergency Department complaining of asthma exacerbation that has persisted throughout the day but worsened at 7pm tonight. Patient has exhausted her inhaler; states that she had been unable to relief it due to issues with her insurance. Patient attributes her attack to the thick humid air and her activity at work. Patient denies cold symptoms. Patient states that her last flare up was a few months and she attributes onset to her increased activity throughout the day. Patient reports using her inhaler at least once a day. She is not taking any other medications to control her asthma.   Past Medical History  Diagnosis Date  . Asthma   . Eczema    Past Surgical History  Procedure Laterality Date  . Mouth surgery     History reviewed. No pertinent family history. History  Substance Use Topics  . Smoking status: Never Smoker   . Smokeless tobacco: Not on file  . Alcohol Use: Yes   OB History   Grav Para Term Preterm Abortions TAB SAB Ect Mult Living                 Review of Systems  Constitutional: Negative for fever and chills.  HENT: Negative for rhinorrhea and sore throat.   Respiratory: Positive for shortness of breath and wheezing.   Cardiovascular: Positive for chest pain.  Neurological: Negative for light-headedness.      Allergies  Peanuts and Sulfa  antibiotics  Home Medications   Prior to Admission medications   Medication Sig Start Date End Date Taking? Authorizing Provider  albuterol (PROVENTIL HFA;VENTOLIN HFA) 108 (90 BASE) MCG/ACT inhaler Inhale 2 puffs into the lungs every 6 (six) hours as needed for wheezing.    Historical Provider, MD  albuterol (PROVENTIL HFA;VENTOLIN HFA) 108 (90 BASE) MCG/ACT inhaler Inhale 1-2 puffs into the lungs every 4 (four) hours as needed for wheezing or shortness of breath. 03/09/13   Candyce ChurnJohn David Wofford III, MD  fluocinonide cream (LIDEX) 0.05 % Apply topically 2 (two) times daily. 02/03/13   Antony MaduraKelly Humes, PA-C  Pseudoephedrine-DM-GG-APAP (TYLENOL COLD SEVERE CONGESTION PO) Take 1 capsule by mouth every 6 (six) hours as needed (cold).     Historical Provider, MD   Triage Vitals: BP 128/82  Pulse 95  Temp(Src) 98.2 F (36.8 C) (Oral)  Resp 20  SpO2 99%  LMP 07/24/2013  Physical Exam  Nursing note and vitals reviewed. Constitutional: She appears well-developed and well-nourished. No distress.  HENT:  Head: Normocephalic and atraumatic.  Eyes: Conjunctivae and EOM are normal. Right eye exhibits no discharge. Left eye exhibits no discharge.  Neck: Normal range of motion. Neck supple.  Cardiovascular: Normal rate, regular rhythm and normal heart sounds.   Pulmonary/Chest: Effort normal. No respiratory distress. She has wheezes (minimal, end expiratory at bases).  Abdominal: Soft.  There is no tenderness.  Musculoskeletal: Normal range of motion.  Neurological: She is alert.  Skin: Skin is warm and dry.  Psychiatric: She has a normal mood and affect. Her behavior is normal.    ED Course  Procedures (including critical care time)  COORDINATION OF CARE: 11:48 PM- Discussed treatment plan with patient at bedside and patient agreed to plan.   Labs Review Labs Reviewed - No data to display  Imaging Review No results found.   EKG Interpretation None      Vital signs reviewed and are as  follows: Filed Vitals:   08/13/13 2315  BP: 128/82  Pulse: 95  Temp: 98.2 F (36.8 C)  Resp: 20   11:56 PM Patient seen after albuterol nebulizer treatment. She has good air movement bilaterally with only minimal end expiratory wheezing at the bases. Mother at bedside states that she is taking deep breaths without any problems. Patient states she is feeling much better.  She is agreeable to discharge home. Will provide albuterol inhaler. Patient is uncertain if she wants to be on oral steroids. I provided patient with a prescription that she can start at home if she is not feeling better overnight and tomorrow.  Parent states that patient is now on her health insurance and will be able to see primary care physician. Encouraged this as patient will likely need to be on controller medications. Patient and family verbalized understanding and agreed plan.  Patient urged to return with worsening symptoms or other concerns. Patient verbalized understanding and agrees with plan.   MDM   Final diagnoses:  Asthma exacerbation   Patient with asthma exacerbation -- improved after one treatment. Given marked improvement -- offered steroids. Patient wants rx for home to fill if breathing worsens again. No concern for PNA given normal lung exam and no other systemic sx of illness. Patient appears well.  Antibiotics not indicated.  I personally performed the services described in this documentation, which was scribed in my presence. The recorded information has been reviewed and is accurate.    Renne CriglerJoshua Geiple, PA-C 08/14/13 0000

## 2013-08-13 NOTE — Discharge Instructions (Signed)
Please read and follow all provided instructions.  Your diagnoses today include:  1. Asthma exacerbation     Tests performed today include:  Vital signs. See below for your results today.   Medications prescribed:   Prednisone - steroid medicine   It is best to take this medication in the morning to prevent sleeping problems. If you are diabetic, monitor your blood sugar closely and stop taking Prednisone if blood sugar is over 300. Take with food to prevent stomach upset.    Albuterol inhaler - medication that opens up your airway  Use inhaler as follows: 1-2 puffs with spacer every 4 hours as needed for wheezing, cough, or shortness of breath.   Take any prescribed medications only as directed.  Home care instructions:  Follow any educational materials contained in this packet.  Follow-up instructions: Please follow-up with your primary care provider in the next 3 days for further evaluation of your symptoms and management of your asthma.  Return instructions:   Please return to the Emergency Department if you experience worsening symptoms.  Please return with worsening wheezing, shortness of breath, or difficulty breathing.  Return with persistent fever above 101F.   Please return if you have any other emergent concerns.  Additional Information:  Your vital signs today were: BP 128/82   Pulse 95   Temp(Src) 98.2 F (36.8 C) (Oral)   Resp 20   SpO2 99%   LMP 07/24/2013 If your blood pressure (BP) was elevated above 135/85 this visit, please have this repeated by your doctor within one month. --------------  Emergency Department Resource Guide 1) Find a Doctor and Pay Out of Pocket Although you won't have to find out who is covered by your insurance plan, it is a good idea to ask around and get recommendations. You will then need to call the office and see if the doctor you have chosen will accept you as a new patient and what types of options they offer for patients  who are self-pay. Some doctors offer discounts or will set up payment plans for their patients who do not have insurance, but you will need to ask so you aren't surprised when you get to your appointment.  2) Contact Your Local Health Department Not all health departments have doctors that can see patients for sick visits, but many do, so it is worth a call to see if yours does. If you don't know where your local health department is, you can check in your phone book. The CDC also has a tool to help you locate your state's health department, and many state websites also have listings of all of their local health departments.  3) Find a Walk-in Clinic If your illness is not likely to be very severe or complicated, you may want to try a walk in clinic. These are popping up all over the country in pharmacies, drugstores, and shopping centers. They're usually staffed by nurse practitioners or physician assistants that have been trained to treat common illnesses and complaints. They're usually fairly quick and inexpensive. However, if you have serious medical issues or chronic medical problems, these are probably not your best option.  No Primary Care Doctor: - Call Health Connect at  [phone] - they can help you locate a primary care doctor that  accepts your insurance, provides certain services, etc. - Physician Referral Service- [phone]  Chronic Pain Problems: Organization         Address  Phone   Notes  [facility]  [phone] Patients need to be referred by their primary care doctor.   Medication Assistance: Organization         Address  Phone   Notes  [facility] [address] [place] [place], [place] [address] [phone] --Must be a resident of [facility] -- Must have NO insurance coverage whatsoever (no Medicaid/ Medicare, etc.) -- The pt. MUST have a primary care doctor that directs their care regularly and follows  them in the community   [facility]  [phone]   [employer]  [phone]    Agencies that provide inexpensive medical care: Organization         Address  Phone   Notes  [facility]  [phone]   [facility]    [phone]   [facility] [address] [place], [place] [address] [phone]   [facility] [address] [place] [phone]   Planned Parenthood    [phone]   [facility]    [phone]   [facility]  [address], [place] Phone:  [phone], Fax:  [phone] Hours of Operation:  9 am - 6 pm, M-F.  Also accepts Medicaid/Medicare and self-pay.  [facility]  [address], [place] Phone: [phone], Fax: [phone]. Hours of Operation:  8:30 am - 5:30 pm, M-F.  Also accepts Medicaid and self-pay.  [facility] [place] [address], [place] Phone: [phone]   [facility] [address] Natasha Bence [place], [place] [phone], Ext. [phone] Mondays & Thursdays: 7-9 AM.  First 15 patients are seen on a first come, first serve basis.    Medicaid-accepting [facility] Providers:  Organization         Address  Phone   Notes  [facility] [address], [place] [phone] Also accepts self-pay patients.  [facility] [address] Laurell Josephs [place], [place]  [phone]   [facility] [address], [place] [phone]   [facility] [address], [place] [phone]   Renaye Rakers [address], [place]   [phone] Only accepts [place] patients after they have their name applied to their card.   Self-Pay (no insurance) in [facility]:  Organization         Address  Phone   Notes  Sickle Cell  Patients, [facility] [address] [place], [place] [phone]   [facility] [address] [place], [place] [phone]   [facility] [place]  [address], [place] [phone]   [facility]/Dr. Osei-Bonsu  [address] [place] or [address], [place] [phone] Phone number for both [place] and [place] locations is the same.  [facility] [address] [place] [phone]   [facility] [address] [place] or [address] [phone]   [facility] [address] [place] [phone], phone; [phone], fax Sees patients 1st and 3rd Saturday of every month.  Must not qualify for public or private insurance (i.e. Medicaid, Medicare, [place] Health  Choice, Veterans' Benefits)  Household income should be no more than 200% of the poverty level The clinic cannot treat you if you are pregnant or think you are pregnant  Sexually transmitted diseases are not treated at the clinic.    Dental Care: Organization         Address  Phone  Notes  [facility] Department of [facility] [address] [place], [place] [phone] Accepts children up to age 71 who are enrolled in [place] or [place] Health Choice; pregnant women with a Medicaid card; and children who have applied for Medicaid or [place] Health Choice, but were declined, whose parents can pay a reduced fee at time of service.  [facility] of [facility]  [address], [place] [phone] Accepts children up to age 47 who are enrolled in [place] or [place] Health Choice; pregnant women with a Medicaid card; and children who have applied for Medicaid or [place] Health Choice, but were declined, whose parents can pay a reduced fee at time of service.  [facility]  [address] [place], [place] [phone] Patients are seen by appointment only. Walk-ins are not accepted. [facility] will see patients 44 years of age and older. Monday - Tuesday (8am-5pm) Most Wednesdays (8:30-5pm) $30 per visit, cash only  [facility] Adult [facility] Access PRO[facility]  [address], [facility] [phone] Patients are seen by appointment only. Walk-ins are not accepted. [facility] will see patients 37 years of age and older. One Wednesday Evening (Monthly: Volunteer Based).  $30 per visit, cash only  [employer]  [phone] for adults; Children under age 67, call Graduate Pediatric Dentistry at [phone]. Children aged 45-14, please call [phone] to request a pediatric application.  Dental services are provided in all areas of dental care including fillings, crowns and bridges, complete and partial dentures, implants, gum treatment, root canals, and extractions. Preventive care is also provided. Treatment is provided to both adults and children. Patients are selected via a lottery and there is often a waiting list.   [facility] [address] [place]  [phone] [email or web address]   [facility] [address] [place], [place] [phone], Ext. [phone] Second and Fourth Thursday of each month, opens at 6:30 AM; Clinic ends at 9 AM.  Patients are seen on a first-come first-served basis, and a limited number are seen during each clinic.   [facility]  [address] Ether Griffins [place], [place] [phone]   Eligibility Requirements You must have lived in [place], [place], or [place] counties for at least the last three months.   You cannot be eligible for state or federal sponsored [place], including [employer], [place], or [employer].   You generally cannot be eligible for healthcare insurance through your employer.    How to apply: Eligibility  screenings are held every Tuesday and Wednesday afternoon from 1:00 pm until 4:00 pm. You do not need an appointment for the interview!  [facility] [address], [place], [place] [phone]   [facility] Health Department  [phone]   [facility] Health Department  [phone]   [facility] Health Department  [phone]    Behavioral Health Resources in the Community: Intensive Outpatient Programs Organization         Address  Phone  Notes  [facility] [address], [place], [place] [phone]   [facility] [address], [place], [place] [phone]   [facility] [address], [place], [place]  [phone]   [facility] [address],  [place], [place] [phone] or [phone]   Substance Abuse Resources Organization         Address  Phone  Notes  [facility]  [phone]   [facility]  [phone]   [facility]  [phone]   [facility]  [phone]   Residential & Outpatient Substance Abuse Program  [phone]   Psychological Services Organization         Address  Phone  Notes  [facility]  [phone]- [phone]   [facility]  [phone]- [phone]   [facility] [address], [place] [phone] or [phone]    Mobile Crisis Teams Organization         Address  Phone  Notes  [facility], Mobile Crisis Care Unit  [phone]   [facility]  [address] [place], [place] [phone]   Doristine LocksSharon DeEsch [address] [place] [phone]    Self-Help/Support Groups Organization         Address  Phone             Notes  [facility] [place] - variety of support groups  336- [phone] Call for more information  Narcotics Anonymous (NA), [facility] [address], [place]  2 meetings at  this location   Statisticianesidential Treatment Programs Organization         Address  Phone  Notes  [facility] [address],    [place]  [phone]   [facility]  [address], [place] [phone], [place], [place] [phone]   [facility] [address], [place] [phone] Admissions: 8am-3pm M-F  [facility] [address]    [place], [place] [phone]   [facility] [address], [place], [place] [phone]   [facility] [address]  [place], [place] [phone]   [facility] - Intensive Outpatient [address], [place], [place] [phone]   [facility] ([facility] [address]  [place], [place] [phone] or [phone]   [facility] ([facility]) [address] [place], [place] [phone] Accepts Medicaid  [facility] [address]  [place] [phone] Substance Abuse/Addiction Treatment   [facility] Resources Organization         Address  Phone  Notes  [facility]  [phone]) [phone]   Angie FavaJulie Brannon, PhD [address], [address] [place], [place]   [phone] or [phone]   [facility]   [address] [place], [place] [phone]) [phone]   [facility] [address], [place], [place] [phone](336) [phone] Insurance/Medicaid/sponsorship through [facility]  [facility] [address]                                    [place], [place] [phone](336) [phone] Therapy/tele-psych/case  [facility] [address].   [place], [place] [phone](336) [phone]    Dr. Lolly MustacheArfeen  [phone]   [facility]  [employer] [facility] 1) [address], [address] [place]  2) [address] [place] 3)  [address], [place] [phone]  [phone]   [facility] Child Abuse Hotline 714-190-7896 or [phone] (After Hours)

## 2013-08-13 NOTE — ED Notes (Signed)
Pt sts respiratory effort has improved after breathing treatment.  Minimal inspiratory wheezing heard in bases after nebulizer treatment.  Sts she feels good to go home.

## 2013-08-14 NOTE — ED Provider Notes (Signed)
Medical screening examination/treatment/procedure(s) were performed by non-physician practitioner and as supervising physician I was immediately available for consultation/collaboration.   EKG Interpretation None        Julie Manly, MD 08/14/13 2258 

## 2013-09-08 ENCOUNTER — Encounter: Payer: Self-pay | Admitting: Family Medicine

## 2013-09-08 ENCOUNTER — Ambulatory Visit (INDEPENDENT_AMBULATORY_CARE_PROVIDER_SITE_OTHER): Payer: 59 | Admitting: Family Medicine

## 2013-09-08 VITALS — BP 100/78 | HR 106 | Temp 99.2°F | Ht 66.5 in | Wt 191.0 lb

## 2013-09-08 DIAGNOSIS — L259 Unspecified contact dermatitis, unspecified cause: Secondary | ICD-10-CM

## 2013-09-08 DIAGNOSIS — L309 Dermatitis, unspecified: Secondary | ICD-10-CM

## 2013-09-08 DIAGNOSIS — Z7189 Other specified counseling: Secondary | ICD-10-CM

## 2013-09-08 DIAGNOSIS — J45901 Unspecified asthma with (acute) exacerbation: Secondary | ICD-10-CM

## 2013-09-08 DIAGNOSIS — Z7689 Persons encountering health services in other specified circumstances: Secondary | ICD-10-CM

## 2013-09-08 DIAGNOSIS — J4541 Moderate persistent asthma with (acute) exacerbation: Secondary | ICD-10-CM

## 2013-09-08 MED ORDER — ALBUTEROL SULFATE HFA 108 (90 BASE) MCG/ACT IN AERS
2.0000 | INHALATION_SPRAY | Freq: Four times a day (QID) | RESPIRATORY_TRACT | Status: DC | PRN
Start: 2013-09-08 — End: 2014-04-05

## 2013-09-08 NOTE — Patient Instructions (Signed)
Get the lung testing done and then we will follow up - if any worsening of asthma in interim call immediately  We recommend the following healthy lifestyle measures: - eat a healthy diet consisting of lots of vegetables, fruits, beans, nuts, seeds, healthy meats such as white chicken and fish and whole grains.  - avoid fried foods, fast food, processed foods, sodas, red meet and other fattening foods.  - get a least 150 minutes of aerobic exercise per week.   Follow up in: in about 1 month for your physical and pap smear and labs - come fasting but drink plenty of water

## 2013-09-08 NOTE — Progress Notes (Signed)
No chief complaint on file.   HPI:  Connie Payne is here to establish care. Used to go to [facility]. Last PCP and physical: never did a pap  Has the following chronic problems and concerns today:  There are no active problems to display for this patient.  Asthma: -diagnosed as child -reports was followed by [facility] in [facility] past -uses albuterol prn -mild symptoms with exercise and at night with heat - mild wheezing, but when runs out of albuterol ends up in ED -needs albuterol refill, told needs to be on another inhaler  Eczema: -sees Dr. Terri Piedra for this   ROS negative for unless reported above: fevers, unintentional weight loss, hearing or vision loss, chest pain, palpitations, struggling to breath, hemoptysis, melena, hematochezia, hematuria, falls, loc, si, thoughts of self harm  Past Medical History  Diagnosis Date  . Asthma   . Eczema   . Allergy     History reviewed. No pertinent family history.  History   Social History  . Marital Status: Single    Spouse Name: N/A    Number of Children: N/A  . Years of Education: N/A   Social History Main Topics  . Smoking status: Never Smoker   . Smokeless tobacco: None  . Alcohol Use: Yes     Comment: occ, 2-3 drinks   . Drug Use: No  . Sexual Activity: No   Other Topics Concern  . None   Social History Narrative   Work or School: [employer]      Home Situation: lives with sister      Spiritual Beliefs: none      Lifestyle: walking; poor diet             Current outpatient prescriptions:albuterol (PROVENTIL HFA;VENTOLIN HFA) 108 (90 BASE) MCG/ACT inhaler, Inhale 2 puffs into the lungs every 6 (six) hours as needed for wheezing., Disp: 1 Inhaler, Rfl: 6;  fluocinonide cream (LIDEX) 0.05 %, Apply topically 2 (two) times daily., Disp: 30 g, Rfl: 0  EXAM:  Filed Vitals:   09/08/13 1354  BP: 100/78  Pulse: 106  Temp: 99.2 F (37.3 C)    Body mass index is 30.37  kg/(m^2).  GENERAL: vitals reviewed and listed above, alert, oriented, appears well hydrated and in no acute distress  HEENT: atraumatic, conjunttiva clear, no obvious abnormalities on inspection of external nose and ears  NECK: no obvious masses on inspection  LUNGS: clear to auscultation bilaterally, no wheezes, rales or rhonchi, good air movement  CV: HRRR, no peripheral edema  MS: moves all extremities without noticeable abnormality  PSYCH: pleasant and cooperative, no obvious depression or anxiety  ASSESSMENT AND PLAN:  Discussed the following assessment and plan:  Asthma with acute exacerbation, moderate persistent - Plan: Pulmonary function test  Eczema  Encounter to establish care  -We reviewed the PMH, PSH, FH, SH, Meds and Allergies. -We provided refills for any medications we will prescribe as needed. -We addressed current concerns per orders and patient instructions. -We have asked for records for pertinent exams, studies, vaccines and notes from previous providers. -We have advised patient to follow up per instructions below.   -Patient advised to return or notify a doctor immediately if symptoms worsen or persist or new concerns arise.  Patient Instructions  Get the lung testing done and then we will follow up - if any worsening of asthma in interim call immediately  We recommend the following healthy lifestyle measures: - eat a healthy  diet consisting of lots of vegetables, fruits, beans, nuts, seeds, healthy meats such as white chicken and fish and whole grains.  - avoid fried foods, fast food, processed foods, sodas, red meet and other fattening foods.  - get a least 150 minutes of aerobic exercise per week.   Follow up in: in about 1 month for your physical and pap smear and labs - come fasting but drink plenty of water      KIM, HANNAH R.

## 2013-09-08 NOTE — Progress Notes (Signed)
Pre visit review using our clinic review tool, if applicable. No additional management support is needed unless otherwise documented below in the visit note. 

## 2013-10-04 ENCOUNTER — Ambulatory Visit (INDEPENDENT_AMBULATORY_CARE_PROVIDER_SITE_OTHER): Payer: 59 | Admitting: Internal Medicine

## 2013-10-04 ENCOUNTER — Encounter (INDEPENDENT_AMBULATORY_CARE_PROVIDER_SITE_OTHER): Payer: Self-pay

## 2013-10-04 DIAGNOSIS — J4541 Moderate persistent asthma with (acute) exacerbation: Secondary | ICD-10-CM

## 2013-10-04 DIAGNOSIS — J45901 Unspecified asthma with (acute) exacerbation: Secondary | ICD-10-CM

## 2013-10-04 DIAGNOSIS — R0600 Dyspnea, unspecified: Secondary | ICD-10-CM

## 2013-10-04 DIAGNOSIS — R942 Abnormal results of pulmonary function studies: Secondary | ICD-10-CM

## 2013-10-04 LAB — PULMONARY FUNCTION TEST
DL/VA % PRED: 113 %
DL/VA: 5.79 ml/min/mmHg/L
DLCO UNC % PRED: 96 %
DLCO unc: 26.67 ml/min/mmHg
FEF 25-75 PRE: 2.31 L/s
FEF 25-75 Post: 2.21 L/sec
FEF2575-%Change-Post: -4 %
FEF2575-%PRED-POST: 61 %
FEF2575-%Pred-Pre: 63 %
FEV1-%CHANGE-POST: -1 %
FEV1-%Pred-Post: 97 %
FEV1-%Pred-Pre: 98 %
FEV1-PRE: 3.03 L
FEV1-Post: 2.99 L
FEV1FVC-%Change-Post: 1 %
FEV1FVC-%PRED-PRE: 84 %
FEV6-%Change-Post: -2 %
FEV6-%PRED-POST: 112 %
FEV6-%Pred-Pre: 115 %
FEV6-POST: 3.98 L
FEV6-Pre: 4.09 L
FEV6FVC-%CHANGE-POST: 0 %
FEV6FVC-%PRED-PRE: 100 %
FEV6FVC-%Pred-Post: 101 %
FVC-%CHANGE-POST: -3 %
FVC-%PRED-PRE: 115 %
FVC-%Pred-Post: 112 %
FVC-POST: 3.98 L
FVC-Pre: 4.11 L
PRE FEV1/FVC RATIO: 74 %
Post FEV1/FVC ratio: 75 %
Post FEV6/FVC ratio: 100 %
Pre FEV6/FVC Ratio: 99 %

## 2013-10-04 NOTE — Progress Notes (Signed)
PFT done today. 

## 2013-10-06 NOTE — Addendum Note (Signed)
Addended by: Johnella MoloneyFUNDERBURK, Kourtni Stineman A on: 10/06/2013 05:04 PM   Modules accepted: Orders

## 2013-10-28 ENCOUNTER — Ambulatory Visit (INDEPENDENT_AMBULATORY_CARE_PROVIDER_SITE_OTHER): Payer: 59 | Admitting: Family Medicine

## 2013-10-28 ENCOUNTER — Other Ambulatory Visit (HOSPITAL_COMMUNITY)
Admission: RE | Admit: 2013-10-28 | Discharge: 2013-10-28 | Disposition: A | Discharge status: Home / Self Care | Payer: 59 | Source: Ambulatory Visit | Attending: Family Medicine | Admitting: Family Medicine

## 2013-10-28 ENCOUNTER — Encounter: Payer: Self-pay | Admitting: Family Medicine

## 2013-10-28 VITALS — BP 116/82 | HR 100 | Temp 98.1°F | Ht 66.5 in | Wt 200.0 lb

## 2013-10-28 DIAGNOSIS — R0609 Other forms of dyspnea: Secondary | ICD-10-CM

## 2013-10-28 DIAGNOSIS — Z0000 Encounter for general adult medical examination without abnormal findings: Secondary | ICD-10-CM

## 2013-10-28 DIAGNOSIS — Z124 Encounter for screening for malignant neoplasm of cervix: Secondary | ICD-10-CM

## 2013-10-28 DIAGNOSIS — Z113 Encounter for screening for infections with a predominantly sexual mode of transmission: Secondary | ICD-10-CM | POA: Insufficient documentation

## 2013-10-28 DIAGNOSIS — R0600 Dyspnea, unspecified: Secondary | ICD-10-CM

## 2013-10-28 DIAGNOSIS — Z01419 Encounter for gynecological examination (general) (routine) without abnormal findings: Secondary | ICD-10-CM | POA: Insufficient documentation

## 2013-10-28 DIAGNOSIS — R0989 Other specified symptoms and signs involving the circulatory and respiratory systems: Secondary | ICD-10-CM

## 2013-10-28 DIAGNOSIS — Z23 Encounter for immunization: Secondary | ICD-10-CM

## 2013-10-28 DIAGNOSIS — E669 Obesity, unspecified: Secondary | ICD-10-CM

## 2013-10-28 LAB — LIPID PANEL
Cholesterol: 132 mg/dL (ref 0–200)
HDL: 43.2 mg/dL (ref >39.00)
LDL CALC: 82 mg/dL (ref 0–99)
NONHDL: 88.8
Total CHOL/HDL Ratio: 3
Triglycerides: 32 mg/dL (ref 0.0–149.0)
VLDL: 6.4 mg/dL (ref 0.0–40.0)

## 2013-10-28 LAB — BASIC METABOLIC PANEL
BUN: 10 mg/dL (ref 6–23)
CALCIUM: 9.2 mg/dL (ref 8.4–10.5)
CO2: 25 mEq/L (ref 19–32)
Chloride: 107 mEq/L (ref 96–112)
Creatinine, Ser: 0.6 mg/dL (ref 0.4–1.2)
GFR: 174.36 mL/min (ref >60.00)
Glucose, Bld: 79 mg/dL (ref 70–99)
POTASSIUM: 3.8 meq/L (ref 3.5–5.1)
SODIUM: 135 meq/L (ref 135–145)

## 2013-10-28 NOTE — Progress Notes (Addendum)
No chief complaint on file.   HPI:  Here for CPE:  -Concerns and/or follow up today:   1)Dyspnea -? Asthma, pretty severe symptoms per her report -many trips to the ED -PFT showed minimal obstruction w/out response to bronchodilator and restrictive process as well -I advised advised she see pulmonologist - per referral notes pt was contacted multiple times to schedule appt but did not call back -has been feeling better so forgot to call to schedule appointment - forgot number to call and request this today -still has wheezing and SOB with exercise  2)Eczema f -managed by Dermatologist  -Diet: "horrible"  -Exercise: no regular exercise - walks 1-2 times per week  -Taking folic acid, vitamin D or calcium: not taking  -Diabetes and Dyslipidemia Screening: doing today - FASTING labs today  -Hx of HTN: no  -Vaccines: agreed to get flu and tdap today  -pap history: 3 years ago and normal  -FDLMP: August 25th, regular, normal  -sexual activity: yes, no new partners  -wants STI testing: yes  -FH breast, colon or ovarian ca: see FH,  -Alcohol, Tobacco, drug use: see social history  Review of Systems - no fevers, unintentional weight loss, vision loss, hearing loss, chest pain, sob, hemoptysis, melena, hematochezia, hematuria, genital discharge, changing or concerning skin lesions, bleeding, bruising, loc, thoughts of self harm or SI  Past Medical History  Diagnosis Date  . Asthma   . Eczema   . Allergy     Past Surgical History  Procedure Laterality Date  . Mouth surgery      No family history on file.  History   Social History  . Marital Status: Single    Spouse Name: N/A    Number of Children: N/A  . Years of Education: N/A   Social History Main Topics  . Smoking status: Never Smoker   . Smokeless tobacco: None  . Alcohol Use: Yes     Comment: occ, 2-3 drinks   . Drug Use: No  . Sexual Activity: No   Other Topics Concern  . None   Social History  Narrative   Work or School: [employer]      Home Situation: lives with sister      Spiritual Beliefs: none      Lifestyle: walking; poor diet             Current outpatient prescriptions:albuterol (PROVENTIL HFA;VENTOLIN HFA) 108 (90 BASE) MCG/ACT inhaler, Inhale 2 puffs into the lungs every 6 (six) hours as needed for wheezing., Disp: 1 Inhaler, Rfl: 6;  fluocinonide cream (LIDEX) 0.05 %, Apply topically 2 (two) times daily., Disp: 30 g, Rfl: 0  EXAM:  Filed Vitals:   10/28/13 0826  BP: 116/82  Pulse: 100  Temp: 98.1 F (36.7 C)   Body mass index is 31.8 kg/(m^2).  GENERAL: vitals reviewed and listed below, alert, oriented, appears well hydrated and in no acute distress  HEENT: head atraumatic, PERRLA, normal appearance of eyes, ears, nose and mouth. moist mucus membranes.  NECK: supple, no masses or lymphadenopathy  LUNGS: clear to auscultation bilaterally, no rales, rhonchi or wheeze  CV: HRRR, no peripheral edema or cyanosis, normal pedal pulses  BREAST: normal appearance - no lesions or discharge, on palpation normal breast tissue without any suspicious masses  ABDOMEN: bowel sounds normal, soft, non tender to palpation, no masses, no rebound or guarding  GU: normal appearance of external genitalia - no lesions or masses, normal vaginal mucosa - no abnormal discharge, normal appearance of  cervix - no lesions or abnormal discharge, no masses or tenderness on palpation of uterus and ovaries.  RECTAL: refused  SKIN: eczematous skin changes throughout  MS: normal gait, moves all extremities normally  NEURO: CN II-XII grossly intact, normal muscle strength and sensation to light touch on extremities  PSYCH: normal affect, pleasant and cooperative  ASSESSMENT AND PLAN:  Discussed the following assessment and plan:  Physical exam, annual - Plan: Lipid panel, Basic metabolic panel, HIV antibody (with reflex), RPR  Cervical cancer screening  Dyspnea  Obesity,  unspecified - Plan: Lipid panel  -Discussed and advised all [place] preventive services health task force level A and B recommendations for age, sex and risks.  -Advised at least 150 minutes of exercise per week and a healthy diet low in saturated fats and sweets and consisting of fresh fruits and vegetables, lean meats such as fish and white chicken and whole grains.  -pap obtained  -FASTING labs, studies and vaccines per orders this encounter  Orders Placed This Encounter  Procedures  . Lipid panel  . Basic metabolic panel  . HIV antibody (with reflex)  . RPR    Patient advised to return to clinic immediately if symptoms worsen or persist or new concerns.  Patient Instructions   [facility]  [address], [place] [phone] . Fax [phone]   Folic acid daily  Vitamin D [phone] IU daily plus calcium  total daily for diet and supplement only if needed  -We have ordered labs or studies at this visit. It can take up to 1-2 weeks for results and processing. We will contact you with instructions IF your results are abnormal. Normal results will be released to your [facility]. If you have not heard from [place] or can not find your results in [facility] in 2 weeks please contact our office.  -PLEASE SIGN UP FOR MYCHART TODAY   We recommend the following healthy lifestyle measures: - eat a healthy diet consisting of lots of vegetables, fruits, beans, nuts, seeds, healthy meats such as white chicken and fish and whole grains.  - avoid fried foods, fast food, processed foods, sodas, red meet and other fattening foods.  - get a least 150 minutes of aerobic exercise per week.   Follow up in: 1 year and or as needed     Return in about 1 year (around 10/29/2014), or if symptoms worsen or fail to improve.  Connie Basque R.

## 2013-10-28 NOTE — Progress Notes (Signed)
Pre visit review using our clinic review tool, if applicable. No additional management support is needed unless otherwise documented below in the visit note. 

## 2013-10-28 NOTE — Addendum Note (Signed)
Addended by: Johnella Moloney on: 10/28/2013 09:24 AM   Modules accepted: Orders

## 2013-10-28 NOTE — Patient Instructions (Signed)
  [facility]  [address], [place] [phone] . Fax [phone]   Folic acid daily  Vitamin D [phone] IU daily plus calcium  total daily for diet and supplement only if needed  -We have ordered labs or studies at this visit. It can take up to 1-2 weeks for results and processing. We will contact you with instructions IF your results are abnormal. Normal results will be released to your [facility]. If you have not heard from [place] or can not find your results in [facility] in 2 weeks please contact our office.  -PLEASE SIGN UP FOR MYCHART TODAY   We recommend the following healthy lifestyle measures: - eat a healthy diet consisting of lots of vegetables, fruits, beans, nuts, seeds, healthy meats such as white chicken and fish and whole grains.  - avoid fried foods, fast food, processed foods, sodas, red meet and other fattening foods.  - get a least 150 minutes of aerobic exercise per week.   Follow up in: 1 year and or as needed

## 2013-10-29 LAB — RPR

## 2013-10-29 LAB — HIV ANTIBODY (ROUTINE TESTING W REFLEX): HIV 1&2 Ab, 4th Generation: NONREACTIVE

## 2013-10-31 LAB — CYTOLOGY - PAP

## 2014-04-05 ENCOUNTER — Other Ambulatory Visit: Payer: Self-pay | Admitting: Family Medicine

## 2014-04-07 ENCOUNTER — Telehealth: Payer: Self-pay | Admitting: Family Medicine

## 2014-04-07 NOTE — Telephone Encounter (Signed)
She was suppose to see pulmonologist for her breathing issues. Advise she schedule pulm appointment. Can provide 1 refill of inhaler to get to appt once appt scheduled.

## 2014-04-07 NOTE — Telephone Encounter (Signed)
Refill sent and the pt is aware to contact the pulmonologist for an appt.

## 2014-04-07 NOTE — Telephone Encounter (Signed)
Pt request refill of the following:   albuterol (PROVENTIL HFA;VENTOLIN HFA) 108 (90 BASE) MCG/ACT inhaler   [facility] [place]

## 2014-05-27 ENCOUNTER — Emergency Department (HOSPITAL_COMMUNITY): Payer: 59

## 2014-05-27 ENCOUNTER — Emergency Department (HOSPITAL_COMMUNITY)
Admission: EM | Admit: 2014-05-27 | Discharge: 2014-05-27 | Disposition: A | Discharge status: Home / Self Care | Payer: 59 | Attending: Emergency Medicine | Admitting: Emergency Medicine

## 2014-05-27 ENCOUNTER — Encounter (HOSPITAL_COMMUNITY): Payer: Self-pay | Admitting: *Deleted

## 2014-05-27 DIAGNOSIS — J45901 Unspecified asthma with (acute) exacerbation: Secondary | ICD-10-CM

## 2014-05-27 DIAGNOSIS — Z3202 Encounter for pregnancy test, result negative: Secondary | ICD-10-CM | POA: Diagnosis not present

## 2014-05-27 DIAGNOSIS — Z7952 Long term (current) use of systemic steroids: Secondary | ICD-10-CM | POA: Diagnosis not present

## 2014-05-27 DIAGNOSIS — Z8619 Personal history of other infectious and parasitic diseases: Secondary | ICD-10-CM | POA: Insufficient documentation

## 2014-05-27 DIAGNOSIS — J45909 Unspecified asthma, uncomplicated: Secondary | ICD-10-CM | POA: Diagnosis present

## 2014-05-27 DIAGNOSIS — Z79899 Other long term (current) drug therapy: Secondary | ICD-10-CM | POA: Diagnosis not present

## 2014-05-27 LAB — POC URINE PREG, ED: Preg Test, Ur: NEGATIVE

## 2014-05-27 MED ORDER — ALBUTEROL SULFATE HFA 108 (90 BASE) MCG/ACT IN AERS
2.0000 | INHALATION_SPRAY | Freq: Once | RESPIRATORY_TRACT | Status: AC
  Administered 2014-05-27: 2 via RESPIRATORY_TRACT
  Filled 2014-05-27: qty 6.7

## 2014-05-27 MED ORDER — ALBUTEROL SULFATE (2.5 MG/3ML) 0.083% IN NEBU
5.0000 mg | INHALATION_SOLUTION | Freq: Once | RESPIRATORY_TRACT | Status: AC
  Administered 2014-05-27: 5 mg via RESPIRATORY_TRACT
  Filled 2014-05-27: qty 6

## 2014-05-27 MED ORDER — PREDNISONE 20 MG PO TABS
60.0000 mg | ORAL_TABLET | Freq: Once | ORAL | Status: AC
  Administered 2014-05-27: 60 mg via ORAL
  Filled 2014-05-27: qty 3

## 2014-05-27 MED ORDER — PREDNISONE 20 MG PO TABS: ORAL_TABLET | ORAL | Status: DC

## 2014-05-27 NOTE — ED Notes (Signed)
Ambulated pt in hallway with pulse ox. Pt's gait was steady. Pt stated that she has no shortness of breathe or trouble walking. Pt was at 100% with pulse at 118.

## 2014-05-27 NOTE — ED Provider Notes (Signed)
CSN: [id number]     Arrival date & time 05/27/14  0801 History   First MD Initiated Contact with Patient 05/27/14 [phone]     Chief Complaint  Patient presents with  . Asthma     (Consider location/radiation/quality/duration/timing/severity/associated sxs/prior Treatment) The history is provided by the patient. No language interpreter was used.  Connie Payne is a 24 year old female with past pedicle history of asthma, eczema, allergies presenting to the ED with asthma flare up that started this morning at approximately 5:30 AM. Patient reported that while at work she had sudden onset of shortness of breath and chest tightness. Stated that this is normal for her to experience these symptoms when her asthma acts up. Reported that she also noticed wheezing when breathing in and out. Reported that since this morning her symptoms have eased off just a bit. Stated that she's been having nasal congestion and coughing starting this morning, reported that she's been coughing up phlegm. Stated that she's also been experiencing itching of her eyes without tearing. Patient reported that she ran out of her albuterol inhaler a couple of days ago. Stated that she has an appointment with her pulmonologist on 06/05/2014-does not know the name of the pulmonologist, this would be her first visit. Stated that she's been dealing with asthma since she was an infant-reported that she has been admitted to the hospital before, but states that this is not occurred since she was 24 years of age. Denied ever having to be intubated. Denied fever, chills, blurred vision, sudden loss of vision, neck pain, neck stiffness, eye pain, eye tearing, chest pain, hemoptysis, travels, leg swelling, headache, dizziness, weakness, fainting, abdominal pain, nausea, vomiting, throat closing sensation, swelling. Denied birth control. LMP 05/06/2014 PCP Dr. Selena Batten  Past Medical History  Diagnosis Date  . Asthma   . Eczema   . Allergy     Past Surgical History  Procedure Laterality Date  . Mouth surgery     History reviewed. No pertinent family history. History  Substance Use Topics  . Smoking status: Never Smoker   . Smokeless tobacco: Not on file  . Alcohol Use: Yes     Comment: occ, 2-3 drinks    OB History    No data available     Review of Systems  Constitutional: Negative for fever and chills.  HENT: Positive for congestion. Negative for ear discharge, ear pain, sore throat and trouble swallowing.   Eyes: Negative for visual disturbance.  Respiratory: Positive for cough, chest tightness, shortness of breath and wheezing.   Cardiovascular: Negative for chest pain.  Gastrointestinal: Negative for nausea, vomiting and abdominal pain.  Musculoskeletal: Negative for neck pain and neck stiffness.  Neurological: Negative for dizziness, weakness and headaches.      Allergies  Peanuts and Sulfa antibiotics  Home Medications   Prior to Admission medications   Medication Sig Start Date End Date Taking? Authorizing Provider  fluocinonide ointment (LIDEX) 0.05 % Apply 1 application topically daily at 12 noon. 05/01/14  Yes Historical Provider, MD  Multiple Vitamins-Minerals (ALIVE WOMENS GUMMY) CHEW Chew 1 tablet by mouth daily at 12 noon.   Yes Historical Provider, MD  PROAIR HFA 108 (90 BASE) MCG/ACT inhaler INHALE TWO PUFFS EVERY 6 HOURS AS NEEDED FOR WHEEZING  04/07/14  Yes Terressa Koyanagi, DO  fluocinonide cream (LIDEX) 0.05 % Apply topically 2 (two) times daily. Patient not taking: Reported on 05/27/2014 02/03/13   Antony Madura, PA-C  predniSONE (DELTASONE) 20 MG tablet 3 tabs po  day one, then 2 tabs daily x 4 days 05/27/14   Saya Mccoll, PA-C   BP 131/83 mmHg  Pulse 118  Temp(Src) 98.2 F (36.8 C) (Oral)  Resp 20  Ht 5' 7.5" (1.715 m)  Wt 197 lb (89.359 kg)  BMI 30.38 kg/m2  SpO2 100%  LMP 05/06/2014 Physical Exam  Constitutional: She is oriented to person, place, and time. She appears  well-developed and well-nourished. No distress.  HENT:  Head: Normocephalic and atraumatic.  Right Ear: Hearing, tympanic membrane, external ear and ear canal normal.  Left Ear: Hearing, tympanic membrane, external ear and ear canal normal.  Mouth/Throat: Oropharynx is clear and moist. No oropharyngeal exudate.  Eyes: Conjunctivae and EOM are normal. Pupils are equal, round, and reactive to light. Right eye exhibits no discharge. Left eye exhibits no discharge.  Neck: Normal range of motion. Neck supple. No tracheal deviation present.  Cardiovascular: Normal rate, regular rhythm and normal heart sounds.  Exam reveals no friction rub.   No murmur heard. Pulses:      Radial pulses are 2+ on the right side, and 2+ on the left side.  Pulmonary/Chest: Effort normal. No respiratory distress. She has wheezes in the right upper field, the right lower field, the left upper field and the left lower field. She has no rales. She exhibits no tenderness.  Patient is able to speak in full sentences without difficulty Negative use of accessory muscles Negative stridor Negative abdominal wall-negative abdominal retractions  Musculoskeletal: Normal range of motion.  Lymphadenopathy:    She has no cervical adenopathy.  Neurological: She is alert and oriented to person, place, and time. No cranial nerve deficit. She exhibits normal muscle tone. Coordination normal.  Skin: Skin is warm and dry. No rash noted. She is not diaphoretic. No erythema.  Psychiatric: She has a normal mood and affect. Her behavior is normal. Thought content normal.  Nursing note and vitals reviewed.   ED Course  Procedures (including critical care time)  Results for orders placed or performed during the hospital encounter of 05/27/14  POC urine preg, ED (not at [facility])  Result Value Ref Range   Preg Test, Ur NEGATIVE NEGATIVE    Labs Review Labs Reviewed  POC URINE PREG, ED    Imaging Review Dg Chest 2 View  05/27/2014    CLINICAL DATA:  Right-sided chest pain.  Shortness breath  EXAM: CHEST  2 VIEW  COMPARISON:  04/22/05  FINDINGS: Normal mediastinum and cardiac silhouette. Normal pulmonary vasculature. No evidence of effusion, infiltrate, or pneumothorax. No acute bony abnormality.  IMPRESSION: Normal chest radiograph.   Electronically Signed   By: Genevive Bi M.D.   On: 05/27/2014 09:25     EKG Interpretation None       9:28 AM This provider reassess the patient. Patient reported that she is feeling much better. Reported that her chest tightness and wheezing have resolved. Stated that when she takes a deep breath and she feels much better and that goes all the way through. Lungs auscultated and clear upon auscultation-negative wheezes or rhonchi heard upon auscultation.  9:34 AM Patient ambulated in the hallway without difficulty. Patient reported that she did not any chest pain, chest tightness, shortness of breath or difficulty breathing when walking. Pulse ox 100% on room air.  MDM   Final diagnoses:  Asthma exacerbation, mild    Medications  albuterol (PROVENTIL HFA;VENTOLIN HFA) 108 (90 BASE) MCG/ACT inhaler 2 puff (not administered)  albuterol (PROVENTIL) (2.5 MG/3ML) 0.083% nebulizer solution  5 mg (5 mg Nebulization Given 05/27/14 0830)  predniSONE (DELTASONE) tablet 60 mg (60 mg Oral Given 05/27/14 0928)    Filed Vitals:   05/27/14 0809 05/27/14 0936  BP: 129/79 131/83  Pulse: 107 118  Temp: 97.5 F (36.4 C) 98.2 F (36.8 C)  TempSrc: Oral   Resp: 20 20  Height: 5' 7.5" (1.715 m)   Weight: 197 lb (89.359 kg)   SpO2: 100% 100%   This provider reviewed patient's chart. Patient has been seen and assessed in ED setting back in 2015 regarding asthma exacerbation multiple times. Patient has never had to be admitted back in 2015 regarding the symptoms. Patient reported that the symptoms are very similar to her asthma flareup. Negative signs of respiratory distress. Urine pregnancy negative.  Chest xray negative for acute cardiopulmonary disease.  Negative findings of pneumonia. Patient presenting to the ED with asthma exacerbation, mild. Negative signs of respiratory distress. Patient given albuterol in ED setting, nebulizer treatment with positive relief-lungs clear to auscultation afterwards. Negative signs of angioedema. Patient stable, afebrile. Patient not septic appearing. Discharged patient. Discharge patient with prednisone and a beer all inhaler-patient ran out of albuterol inhaler. Discussed with patient to rest and stay hydrated. Referred patient to PCP and pulmonologist, discussed with patient to keep appointment with pulmonologist on 06/05/2014. Discussed with patient to closely monitor symptoms and if symptoms are to worsen or change to report back to the ED - strict return instructions given.  Patient agreed to plan of care, understood, all questions answered.   Raymon MuttonMarissa Edyn Qazi, PA-C 05/27/14 1004  Glynn OctaveStephen Rancour, MD 05/27/14 [phone]

## 2014-05-27 NOTE — ED Notes (Signed)
Pt reports while at work her asthma started.

## 2014-05-27 NOTE — Discharge Instructions (Signed)
Please call your doctor for a followup appointment within 24-48 hours. When you talk to your doctor please let them know that you were seen in the emergency department and have them acquire all of your records so that they can discuss the findings with you and formulate a treatment plan to fully care for your new and ongoing problems. Please follow-up with primary care provider Please follow-up with pulmonologist, keep appointment on 06/05/2014 Please rest and stay hydrated Please use albuterol - 2 puffs every 4 hours as needed for wheezing or shortness of breath Please take prednisone as prescribed Please continue to monitor symptoms closely and if symptoms are to worsen or change (fever greater than 101, chills, sweating, nausea, vomiting, chest pain, shortness of breathe, difficulty breathing, weakness, numbness, tingling, worsening or changes to pain pattern, throat closing sensation, tongue swelling or lip swelling, inability to swallow, coughing up blood, increased wheezing) please report back to the Emergency Department immediately.    Asthma, Acute Bronchospasm Acute bronchospasm caused by asthma is also referred to as an asthma attack. Bronchospasm means your air passages become narrowed. The narrowing is caused by inflammation and tightening of the muscles in the air tubes (bronchi) in your lungs. This can make it hard to breathe or cause you to wheeze and cough. CAUSES Possible triggers are:  Animal dander from the skin, hair, or feathers of animals.  Dust mites contained in house dust.  Cockroaches.  Pollen from trees or grass.  Mold.  Cigarette or tobacco smoke.  Air pollutants such as dust, household cleaners, hair sprays, aerosol sprays, paint fumes, strong chemicals, or strong odors.  Cold air or weather changes. Cold air may trigger inflammation. Winds increase molds and pollens in the air.  Strong emotions such as crying or laughing hard.  Stress.  Certain  medicines such as aspirin or beta-blockers.  Sulfites in foods and drinks, such as dried fruits and wine.  Infections or inflammatory conditions, such as a flu, cold, or inflammation of the nasal membranes (rhinitis).  Gastroesophageal reflux disease (GERD). GERD is a condition where stomach acid backs up into your esophagus.  Exercise or strenuous activity. SIGNS AND SYMPTOMS   Wheezing.  Excessive coughing, particularly at night.  Chest tightness.  Shortness of breath. DIAGNOSIS  Your health care provider will ask you about your medical history and perform a physical exam. A chest X-ray or blood testing may be performed to look for other causes of your symptoms or other conditions that may have triggered your asthma attack. TREATMENT  Treatment is aimed at reducing inflammation and opening up the airways in your lungs. Most asthma attacks are treated with inhaled medicines. These include quick relief or rescue medicines (such as bronchodilators) and controller medicines (such as inhaled corticosteroids). These medicines are sometimes given through an inhaler or a nebulizer. Systemic steroid medicine taken by mouth or given through an IV tube also can be used to reduce the inflammation when an attack is moderate or severe. Antibiotic medicines are only used if a bacterial infection is present.  HOME CARE INSTRUCTIONS   Rest.  Drink plenty of liquids. This helps the mucus to remain thin and be easily coughed up. Only use caffeine in moderation and do not use alcohol until you have recovered from your illness.  Do not smoke. Avoid being exposed to secondhand smoke.  You play a critical role in keeping yourself in good health. Avoid exposure to things that cause you to wheeze or to have breathing problems.  Keep your medicines up-to-date and available. Carefully follow your health care provider's treatment plan.  Take your medicine exactly as prescribed.  When pollen or pollution  is bad, keep windows closed and use an air conditioner or go to places with air conditioning.  Asthma requires careful medical care. See your health care provider for a follow-up as advised. If you are more than [redacted] weeks pregnant and you were prescribed any new medicines, let your obstetrician know about the visit and how you are doing. Follow up with your health care provider as directed.  After you have recovered from your asthma attack, make an appointment with your outpatient doctor to talk about ways to reduce the likelihood of future attacks. If you do not have a doctor who manages your asthma, make an appointment with a primary care doctor to discuss your asthma. SEEK IMMEDIATE MEDICAL CARE IF:   You are getting worse.  You have trouble breathing. If severe, call your local emergency services (911 in the [place]  You develop chest pain or discomfort.  You are vomiting.  You are not able to keep fluids down.  You are coughing up yellow, green, brown, or bloody sputum.  You have a fever and your symptoms suddenly get worse.  You have trouble swallowing. MAKE SURE YOU:   Understand these instructions.  Will watch your condition.  Will get help right away if you are not doing well or get worse. Document Released: 05/21/2006 Document Revised: 02/08/2013 Document Reviewed: 08/11/2012 [facility] Patient Information 2015 [place]. This information is not intended to replace advice given to you by your health care provider. Make sure you discuss any questions you have with your health care provider.

## 2014-05-27 NOTE — ED Notes (Signed)
O2 sat 100% with neb treatment.

## 2014-06-05 ENCOUNTER — Encounter: Payer: Self-pay | Admitting: Internal Medicine

## 2014-06-05 ENCOUNTER — Ambulatory Visit (INDEPENDENT_AMBULATORY_CARE_PROVIDER_SITE_OTHER): Payer: 59 | Admitting: Internal Medicine

## 2014-06-05 VITALS — BP 112/80 | HR 84 | Ht 68.0 in | Wt 196.0 lb

## 2014-06-05 DIAGNOSIS — R0600 Dyspnea, unspecified: Secondary | ICD-10-CM | POA: Diagnosis not present

## 2014-06-05 DIAGNOSIS — Z8709 Personal history of other diseases of the respiratory system: Secondary | ICD-10-CM | POA: Diagnosis not present

## 2014-06-05 NOTE — Patient Instructions (Addendum)
ICD-9-CM ICD-10-CM   1. Dyspnea 786.09 R06.00 Pulmonary Function Test  2. History of asthma V12.69 Z87.09    Do methacholine challenge test Signe release to get records from [facility]  Followup - next few weeks with me or my NP but after methacholine challenge test

## 2014-06-05 NOTE — Progress Notes (Signed)
Subjective:    Patient ID: Connie Payne, female    DOB: 12/05/1990, 24 y.o.   MRN: [id number]  PCP Kriste BasqueKIM, Connie R., DO   HPI  IOV 06/05/2014   24 year old female referred for asthma. She presents with her significant other female. She reports childhood history of asthma since age 639 months. She's had several admissions to the hospital as a young child. Similar between 89 and 24 years of age was on maintenance inhalers or  Corticosteroids. After that she's only been on albuterol as needed. It appears that her teen years was sought of okay. At age 24 she did undergo allergy [facility]. Details are unknown according to her. Apparently they did not recommend allergy shots but she was allergic to peanuts and pollen. Recently she feels over the last few years her asthma has increased in severity. For the year 2015 she's had 4 emergency room visits and at least 2 of them she's required prednisone taper. For the year 2016 she's had one emergency room visit which required a prednisone taper.  In between episodes she constantly describes respiratory symptoms of cough chest tightness and shortness of breath. The one week and a 4 week baseline symptomatology is documented below. She believes the symptoms are from asthma.   Current symptoms include for the past one week: Waking up a few times at night because of asthma, very mild symptoms when waking up in  morning, slightly limited with activities because of asthma, experiencing a little shortness of breath because of asthma and wheezing a little of the time but using albuterol in inhaler 3-4 puffs per day on most days   Current symptoms for the past 4 weeks include having asthma symptoms a little of the time that prevents her activities at work [employer][she stocks supplies at [employer]], having shortness of breath 3-6 times a week, waking up in the middle of the night 2-3 nights a week and using albuterol at least one to 2 times per  day on an average and a subjective sensation that asthma is only somewhat controlled   Pulmonary function test 10/04/2013 personally reviewed is stone cold normal Allergy testing: Apparently done 6 years ago and will have to be queried from outside office Chest x-ray 05/27/2014: Personally reviewed image is clear   Asthma Control Panel 06/05/2014    Current Med Regimen Alb prn  ACQ 5 point- 1 week (wtd avg score) 1.8  ACQ 7 point - 1 week (wtd avg score) 1.7  ACT - 4 week (total score, <19= symptoms) 14  FeNO ppB 17 ppB   FeV1  2.99L/97% in aug 2015  Plan for visit mct test      has a past medical history of Asthma; Eczema; and Allergy.   reports that she has been smoking Cigarettes.  She has a .75 pack-year smoking history. She has never used smokeless tobacco.  Past Surgical History  Procedure Laterality Date  . Mouth surgery      Allergies  Allergen Reactions  . Peanuts [Peanut Oil] Anaphylaxis, Hives and Itching  . Sulfa Antibiotics Nausea And Vomiting    Immunization History  Administered Date(s) Administered  . Influenza,inj,Quad PF,36+ Mos 10/28/2013  . Tdap 10/28/2013    Family History  Problem Relation Age of Onset  . Breast cancer Maternal Grandfather      Current outpatient prescriptions:  .  fluocinonide cream (LIDEX) 0.05 %, Apply topically 2 (two) times daily., Disp: 30 g, Rfl: 0 .  Multiple Vitamins-Minerals (ALIVE WOMENS GUMMY) CHEW, Chew 1 tablet by mouth daily at 12 noon., Disp: , Rfl:  .  PROAIR HFA 108 (90 BASE) MCG/ACT inhaler, INHALE TWO PUFFS EVERY 6 HOURS AS NEEDED FOR WHEEZING , Disp: 8 g, Rfl: 0      Review of Systems  Constitutional: Negative for fever and unexpected weight change.  HENT: Negative for congestion, dental problem, ear pain, nosebleeds, postnasal drip, rhinorrhea, sinus pressure, sneezing, sore throat and trouble swallowing.   Eyes: Negative for redness and itching.  Respiratory: Positive for cough, chest tightness  and shortness of breath. Negative for wheezing.   Cardiovascular: Positive for chest pain. Negative for palpitations and leg swelling.  Gastrointestinal: Negative for nausea and vomiting.  Genitourinary: Negative for dysuria.  Musculoskeletal: Positive for joint swelling.  Skin: Negative for rash.  Neurological: Negative for headaches.  Hematological: Does not bruise/bleed easily.  Psychiatric/Behavioral: Negative for dysphoric mood. The patient is not nervous/anxious.        Objective:   Physical Exam  Constitutional: She is oriented to person, place, and time. She appears well-developed and well-nourished. No distress.  HENT:  Head: Normocephalic and atraumatic.  Right Ear: External ear normal.  Left Ear: External ear normal.  Mouth/Throat: Oropharynx is clear and moist. No oropharyngeal exudate.  Eyes: Conjunctivae and EOM are normal. Pupils are equal, round, and reactive to light. Right eye exhibits no discharge. Left eye exhibits no discharge. No scleral icterus.  Neck: Normal range of motion. Neck supple. No JVD present. No tracheal deviation present. No thyromegaly present.  Cardiovascular: Normal rate, regular rhythm, normal heart sounds and intact distal pulses.  Exam reveals no gallop and no friction rub.   No murmur heard. Pulmonary/Chest: Effort normal and breath sounds normal. No respiratory distress. She has no wheezes. She has no rales. She exhibits no tenderness.  Abdominal: Soft. Bowel sounds are normal. She exhibits no distension and no mass. There is no tenderness. There is no rebound and no guarding.  Musculoskeletal: Normal range of motion. She exhibits no edema or tenderness.  Lymphadenopathy:    She has no cervical adenopathy.  Neurological: She is alert and oriented to person, place, and time. She has normal reflexes. No cranial nerve deficit. She exhibits normal muscle tone. Coordination normal.  Skin: Skin is warm and dry. No rash noted. She is not diaphoretic.  No erythema. No pallor.  Psychiatric: She has a normal mood and affect. Her behavior is normal. Judgment and thought content normal.  Vitals reviewed.   Filed Vitals:   06/05/14 1502  BP: 112/80  Pulse: 84  Height:  (1.727 m)  Weight: 196 lb (88.905 kg)  SpO2: 98%         Assessment & Plan:     ICD-9-CM ICD-10-CM   1. Dyspnea 786.09 R06.00 Pulmonary Function Test  2. History of asthma V12.69 Z87.09     All she has is a history of asthma and symptoms suggestive of asthma. But objective evidence for active asthma is currently lacking because Pulmicort function test August 2015 is normal and exhaled nitric oxide today 06/05/2014 is also normal (while being on albuterol alone when necessary treatment].  Therefore we need to differentiate if she has asthma or an asthma mimic such as vocal cord dysfunction. I have explained to her as such and she understands\  We will go ahead with methacholine challenge test [she believes she is not pregnant)  We'll get outside evaluation of allergy testing  We'll review her in  a few weeks for follow-up  Till then only albuterol as needed   Dr. Kalman Shan, M.D., [facility].C.P Pulmonary and Critical Care Medicine Staff Physician [facility] Pager: [phone], If no answer or between  15:00h - 7:00h: call [phone]  06/05/2014 3:37 PM

## 2014-06-12 ENCOUNTER — Ambulatory Visit: Payer: 59 | Admitting: Adult Health

## 2014-06-14 ENCOUNTER — Encounter (HOSPITAL_COMMUNITY): Payer: 59

## 2014-06-26 ENCOUNTER — Ambulatory Visit: Payer: 59 | Admitting: Adult Health

## 2014-07-05 ENCOUNTER — Emergency Department (HOSPITAL_COMMUNITY)
Admission: EM | Admit: 2014-07-05 | Discharge: 2014-07-05 | Disposition: A | Discharge status: Home / Self Care | Payer: 59 | Attending: Emergency Medicine | Admitting: Emergency Medicine

## 2014-07-05 ENCOUNTER — Encounter (HOSPITAL_COMMUNITY): Payer: Self-pay | Admitting: Emergency Medicine

## 2014-07-05 DIAGNOSIS — J45901 Unspecified asthma with (acute) exacerbation: Secondary | ICD-10-CM | POA: Insufficient documentation

## 2014-07-05 DIAGNOSIS — Z7952 Long term (current) use of systemic steroids: Secondary | ICD-10-CM | POA: Diagnosis not present

## 2014-07-05 DIAGNOSIS — Z720 Tobacco use: Secondary | ICD-10-CM | POA: Diagnosis not present

## 2014-07-05 DIAGNOSIS — J45909 Unspecified asthma, uncomplicated: Secondary | ICD-10-CM

## 2014-07-05 DIAGNOSIS — Z872 Personal history of diseases of the skin and subcutaneous tissue: Secondary | ICD-10-CM | POA: Diagnosis not present

## 2014-07-05 DIAGNOSIS — R0602 Shortness of breath: Secondary | ICD-10-CM | POA: Diagnosis present

## 2014-07-05 MED ORDER — ALBUTEROL SULFATE HFA 108 (90 BASE) MCG/ACT IN AERS
2.0000 | INHALATION_SPRAY | Freq: Once | RESPIRATORY_TRACT | Status: AC
  Administered 2014-07-05: 2 via RESPIRATORY_TRACT
  Filled 2014-07-05: qty 6.7

## 2014-07-05 MED ORDER — ALBUTEROL SULFATE (2.5 MG/3ML) 0.083% IN NEBU
5.0000 mg | INHALATION_SOLUTION | Freq: Once | RESPIRATORY_TRACT | Status: AC
  Administered 2014-07-05: 5 mg via RESPIRATORY_TRACT
  Filled 2014-07-05: qty 6

## 2014-07-05 NOTE — ED Provider Notes (Signed)
CSN: [id number]     Arrival date & time 07/05/14  [id number] History   First MD Initiated Contact with Patient 07/05/14 [id number]     Chief Complaint  Patient presents with  . Asthma     (Consider location/radiation/quality/duration/timing/severity/associated sxs/prior Treatment) Patient is a 24 y.o. female presenting with shortness of breath.  Shortness of Breath Severity:  Moderate Onset quality:  Gradual Duration: a few hours. Timing:  Constant Progression:  Resolved Chronicity:  Recurrent Context comment:  Ran out of inhaler a few days ago, she thinks this is the cause of her symptoms.  Relieved by: breathing treatment. Worsened by:  Nothing tried Associated symptoms: cough and rash (eczema)   Associated symptoms: no chest pain, no fever, no sore throat and no sputum production     Past Medical History  Diagnosis Date  . Asthma   . Eczema   . Allergy    Past Surgical History  Procedure Laterality Date  . Mouth surgery     Family History  Problem Relation Age of Onset  . Breast cancer Maternal Grandfather    History  Substance Use Topics  . Smoking status: Current Every Day Smoker -- 0.25 packs/day for 3 years    Types: Cigarettes  . Smokeless tobacco: Never Used     Comment: Pt stated she smoked intermittently for 3 years.   . Alcohol Use: 0.0 oz/week    0 Standard drinks or equivalent per week     Comment: occ, 2-3 drinks    OB History    No data available     Review of Systems  Constitutional: Negative for fever.  HENT: Negative for sore throat.   Respiratory: Positive for cough and shortness of breath. Negative for sputum production.   Cardiovascular: Negative for chest pain.  Skin: Positive for rash (eczema).  All other systems reviewed and are negative.     Allergies  Peanuts and Sulfa antibiotics  Home Medications   Prior to Admission medications   Medication Sig Start Date End Date Taking? Authorizing Provider  fluocinonide cream (LIDEX) 0.05 %  Apply topically 2 (two) times daily. 02/03/13   Antony MaduraKelly Humes, PA-C  Multiple Vitamins-Minerals (ALIVE WOMENS GUMMY) CHEW Chew 1 tablet by mouth daily at 12 noon.    Historical Provider, MD  PROAIR HFA 108 (90 BASE) MCG/ACT inhaler INHALE TWO PUFFS EVERY 6 HOURS AS NEEDED FOR WHEEZING  04/07/14   Terressa KoyanagiHannah R Kim, DO   BP 114/64 mmHg  Pulse 82  Temp(Src) 98.3 F (36.8 C) (Oral)  Resp 16  Ht 5\' 6"  (1.676 m)  Wt 192 lb (87.091 kg)  BMI 31.00 kg/m2  SpO2 99%  LMP 06/26/2014 Physical Exam  Constitutional: She is oriented to person, place, and time. She appears well-developed and well-nourished. No distress.  HENT:  Head: Normocephalic and atraumatic.  Mouth/Throat: Oropharynx is clear and moist.  Eyes: Conjunctivae are normal. Pupils are equal, round, and reactive to light. No scleral icterus.  Neck: Neck supple.  Cardiovascular: Normal rate, regular rhythm, normal heart sounds and intact distal pulses.   No murmur heard. Pulmonary/Chest: Effort normal and breath sounds normal. No stridor. No respiratory distress. She has no wheezes. She has no rales.  Abdominal: Soft. Bowel sounds are normal. She exhibits no distension. There is no tenderness.  Musculoskeletal: Normal range of motion.  Neurological: She is alert and oriented to person, place, and time.  Skin: Skin is warm and dry. No rash noted.  Psychiatric: She has a normal mood and affect.  Her behavior is normal.  Nursing note and vitals reviewed.   ED Course  Procedures (including critical care time) Labs Review Labs Reviewed - No data to display  Imaging Review No results found.   EKG Interpretation None      MDM   Final diagnoses:  Asthma, unspecified asthma severity, uncomplicated    24 yo female with hx of asthma presenting with SOB.  At time of my evaluation, her symptoms had completely resolved after albuterol treatment given per triage protocol.  She ran out of her inhaler, which she says she uses daily.  She is in  the midst of a workup by pulmonology, who can direct further chronic therapy.  I don't think she has had an acute asthma exacerbation today.  I think she just needs a new albuterol inhaler.  Therefore, don't think she needs steroids.      Blake DivineJohn Lovett Coffin, MD 07/05/14 [phone]

## 2014-07-05 NOTE — Discharge Instructions (Signed)

## 2014-07-05 NOTE — ED Notes (Signed)
Pt. reports asthma attack this morning with wheezing and occasional productive cough . Pt. stated she has no rescue inhaler at home . Denies fever or chills.

## 2014-07-10 ENCOUNTER — Telehealth: Payer: Self-pay | Admitting: Internal Medicine

## 2014-07-10 NOTE — Telephone Encounter (Signed)
Pt cancelled MCT on 06/14/2014 and OV on 06/26/2014. Pt has no pending methacholine challenge test nor f/u.   PCC's please advise when this can scheduled. Thanks.

## 2014-07-10 NOTE — Telephone Encounter (Signed)
Only allergy notes from DR Stanton CallasSharma dates to 11/15/09 reviewed - dixagnos if severe atopic dermaitis and seasonal/perennial allergic rhinitis with asthma mentioned.   Robynn Panelise When is her MCT test and fu?  Thanks  Dr. Kalman ShanMurali Marieta Markov, M.D., [facility].C.P Pulmonary and Critical Care Medicine Staff Physician [facility] Pager: [phone], If no answer or between  15:00h - 7:00h: call [phone]  07/10/2014 6:13 AM

## 2014-07-11 NOTE — Telephone Encounter (Signed)
Apt for MCT@[facility]  07/18/14@11am  pt aware and transferred to front schedulers to set up flu appt Tobe SosSally E Ottinger

## 2014-07-11 NOTE — Telephone Encounter (Signed)
[email or web address] with [email or web address]  pulmonary Tobe SosSally E Ottinger

## 2014-07-18 ENCOUNTER — Encounter (HOSPITAL_COMMUNITY): Payer: 59

## 2014-07-20 ENCOUNTER — Ambulatory Visit: Payer: 59 | Admitting: Adult Health

## 2014-09-11 ENCOUNTER — Encounter (HOSPITAL_COMMUNITY): Payer: Self-pay | Admitting: *Deleted

## 2014-09-11 ENCOUNTER — Emergency Department (HOSPITAL_COMMUNITY)
Admission: EM | Admit: 2014-09-11 | Discharge: 2014-09-12 | Disposition: A | Discharge status: Home / Self Care | Payer: 59 | Attending: Emergency Medicine | Admitting: Emergency Medicine

## 2014-09-11 DIAGNOSIS — J45901 Unspecified asthma with (acute) exacerbation: Secondary | ICD-10-CM | POA: Insufficient documentation

## 2014-09-11 DIAGNOSIS — Z872 Personal history of diseases of the skin and subcutaneous tissue: Secondary | ICD-10-CM | POA: Diagnosis not present

## 2014-09-11 DIAGNOSIS — Z7952 Long term (current) use of systemic steroids: Secondary | ICD-10-CM | POA: Diagnosis not present

## 2014-09-11 DIAGNOSIS — R079 Chest pain, unspecified: Secondary | ICD-10-CM | POA: Insufficient documentation

## 2014-09-11 DIAGNOSIS — Z720 Tobacco use: Secondary | ICD-10-CM | POA: Diagnosis not present

## 2014-09-11 DIAGNOSIS — R062 Wheezing: Secondary | ICD-10-CM | POA: Diagnosis present

## 2014-09-11 MED ORDER — ALBUTEROL SULFATE (2.5 MG/3ML) 0.083% IN NEBU
5.0000 mg | INHALATION_SOLUTION | Freq: Once | RESPIRATORY_TRACT | Status: AC
  Administered 2014-09-12: 5 mg via RESPIRATORY_TRACT
  Filled 2014-09-11: qty 6

## 2014-09-11 NOTE — ED Notes (Signed)
Pt in c/o asthma attack, states she has been out of her inhaler for a few weeks and tonight started wheezing at work, wheezing noted, pt speaking in full sentences

## 2014-09-12 MED ORDER — ALBUTEROL SULFATE HFA 108 (90 BASE) MCG/ACT IN AERS: 2.0000 | INHALATION_SPRAY | Freq: Four times a day (QID) | RESPIRATORY_TRACT | Status: DC | PRN

## 2014-09-12 MED ORDER — PREDNISONE 20 MG PO TABS: 40.0000 mg | ORAL_TABLET | Freq: Every day | ORAL | Status: DC

## 2014-09-12 MED ORDER — PREDNISONE 20 MG PO TABS
40.0000 mg | ORAL_TABLET | Freq: Once | ORAL | Status: AC
  Administered 2014-09-12: 40 mg via ORAL
  Filled 2014-09-12: qty 2

## 2014-09-12 MED ORDER — ALBUTEROL SULFATE HFA 108 (90 BASE) MCG/ACT IN AERS
2.0000 | INHALATION_SPRAY | Freq: Once | RESPIRATORY_TRACT | Status: AC
  Administered 2014-09-12: 2 via RESPIRATORY_TRACT
  Filled 2014-09-12: qty 6.7

## 2014-09-12 NOTE — Discharge Instructions (Signed)
Please follow up with your primary care physician in 1-2 days. If you do not have one please call the [facility] number listed above. Please read all discharge instructions and return precautions.  ° ° °Asthma °Asthma is a recurring condition in which the airways tighten and narrow. Asthma can make it difficult to breathe. It can cause coughing, wheezing, and shortness of breath. Asthma episodes, also called asthma attacks, range from minor to life-threatening. Asthma cannot be cured, but medicines and lifestyle changes can help control it. °CAUSES °Asthma is believed to be caused by inherited (genetic) and environmental factors, but its exact cause is unknown. Asthma may be triggered by allergens, lung infections, or irritants in the air. Asthma triggers are different for each person. Common triggers include:  °· Animal dander. °· Dust mites. °· Cockroaches. °· Pollen from trees or grass. °· Mold. °· Smoke. °· Air pollutants such as dust, household cleaners, hair sprays, aerosol sprays, paint fumes, strong chemicals, or strong odors. °· Cold air, weather changes, and winds (which increase molds and pollens in the air). °· Strong emotional expressions such as crying or laughing hard. °· Stress. °· Certain medicines (such as aspirin) or types of drugs (such as beta-blockers). °· Sulfites in foods and drinks. Foods and drinks that may contain sulfites include dried fruit, potato chips, and sparkling grape juice. °· Infections or inflammatory conditions such as the flu, a cold, or an inflammation of the nasal membranes (rhinitis). °· Gastroesophageal reflux disease (GERD). °· Exercise or strenuous activity. °SYMPTOMS °Symptoms may occur immediately after asthma is triggered or many hours later. Symptoms include: °· Wheezing. °· Excessive nighttime or early morning coughing. °· Frequent or severe coughing with a common cold. °· Chest tightness. °· Shortness of breath. °DIAGNOSIS  °The diagnosis of  asthma is made by a review of your medical history and a physical exam. Tests may also be performed. These may include: °· Lung function studies. These tests show how much air you breathe in and out. °· Allergy tests. °· Imaging tests such as X-rays. °TREATMENT  °Asthma cannot be cured, but it can usually be controlled. Treatment involves identifying and avoiding your asthma triggers. It also involves medicines. There are 2 classes of medicine used for asthma treatment:  °· Controller medicines. These prevent asthma symptoms from occurring. They are usually taken every day. °· Reliever or rescue medicines. These quickly relieve asthma symptoms. They are used as needed and provide short-term relief. °Your health care provider will help you create an asthma action plan. An asthma action plan is a written plan for managing and treating your asthma attacks. It includes a list of your asthma triggers and how they may be avoided. It also includes information on when medicines should be taken and when their dosage should be changed. An action plan may also involve the use of a device called a peak flow meter. A peak flow meter measures how well the lungs are working. It helps you monitor your condition. °HOME CARE INSTRUCTIONS  °· Take medicines only as directed by your health care provider. Speak with your health care provider if you have questions about how or when to take the medicines. °· Use a peak flow meter as directed by your health care provider. Record and keep track of readings. °· Understand and use the action plan to help minimize or stop an asthma attack without needing to seek medical care. °· Control your home environment in the following ways to help prevent   asthma attacks: °¨ Do not smoke. Avoid being exposed to secondhand smoke. °¨ Change your heating and air conditioning filter regularly. °¨ Limit your use of fireplaces and wood stoves. °¨ Get rid of pests (such as roaches and mice) and their  droppings. °¨ Throw away plants if you see mold on them. °¨ Clean your floors and dust regularly. Use unscented cleaning products. °¨ Try to have someone else vacuum for you regularly. Stay out of rooms while they are being vacuumed and for a short while afterward. If you vacuum, use a dust mask from a hardware store, a double-layered or microfilter vacuum cleaner bag, or a vacuum cleaner with a HEPA filter. °¨ Replace carpet with wood, tile, or vinyl flooring. Carpet can trap dander and dust. °¨ Use allergy-proof pillows, mattress covers, and box spring covers. °¨ Wash bed sheets and blankets every week in hot water and dry them in a dryer. °¨ Use blankets that are made of polyester or cotton. °¨ Clean bathrooms and kitchens with bleach. If possible, have someone repaint the walls in these rooms with mold-resistant paint. Keep out of the rooms that are being cleaned and painted. °¨ Wash hands frequently. °SEEK MEDICAL CARE IF:  °· You have wheezing, shortness of breath, or a cough even if taking medicine to prevent attacks. °· The colored mucus you cough up (sputum) is thicker than usual. °· Your sputum changes from clear or white to yellow, green, gray, or bloody. °· You have any problems that may be related to the medicines you are taking (such as a rash, itching, swelling, or trouble breathing). °· You are using a reliever medicine more than 2-3 times per week. °· Your peak flow is still at 50-79% of your personal best after following your action plan for 1 hour. °· You have a fever. °SEEK IMMEDIATE MEDICAL CARE IF:  °· You seem to be getting worse and are unresponsive to treatment during an asthma attack. °· You are short of breath even at rest. °· You get short of breath when doing very little physical activity. °· You have difficulty eating, drinking, or talking due to asthma symptoms. °· You develop chest pain. °· You develop a fast heartbeat. °· You have a bluish color to your lips or fingernails. °· You  are light-headed, dizzy, or faint. °· Your peak flow is less than 50% of your personal best. °MAKE SURE YOU:  °· Understand these instructions. °· Will watch your condition. °· Will get help right away if you are not doing well or get worse. °Document Released: 02/03/2005 Document Revised: 06/20/2013 Document Reviewed: 09/02/2012 °Exit[employer]® Patient Information ©2015 [employer]. This information is not intended to replace advice given to you by your health care provider. Make sure you discuss any questions you have with your health care provider. ° °

## 2014-09-12 NOTE — ED Provider Notes (Signed)
CSN: [id number]     Arrival date & time 09/11/14  2347 History   First MD Initiated Contact with Patient 09/11/14 2358     Chief Complaint  Patient presents with  . Asthma     (Consider location/radiation/quality/duration/timing/severity/associated sxs/prior Treatment) HPI Comments: Pt in c/o asthma attack, states she has been out of her inhaler for a few weeks and tonight started wheezing at work  Patient is a 24 y.o. female presenting with asthma and wheezing.  Asthma Associated symptoms include coughing. Pertinent negatives include no fever or sore throat.  Wheezing Severity:  Mild Severity compared to prior episodes:  Similar Onset quality:  Sudden Timing:  Constant Progression:  Unchanged Chronicity:  Recurrent Relieved by:  Nothing Worsened by:  Nothing tried Ineffective treatments:  None tried Associated symptoms: chest tightness and cough   Associated symptoms: no fever, no shortness of breath and no sore throat   Risk factors: no prior ICU admissions and no prior intubations     Past Medical History  Diagnosis Date  . Asthma   . Eczema   . Allergy    Past Surgical History  Procedure Laterality Date  . Mouth surgery     Family History  Problem Relation Age of Onset  . Breast cancer Maternal Grandfather    History  Substance Use Topics  . Smoking status: Current Every Day Smoker -- 0.25 packs/day for 3 years    Types: Cigarettes  . Smokeless tobacco: Never Used     Comment: Pt stated she smoked intermittently for 3 years.   . Alcohol Use: 0.0 oz/week    0 Standard drinks or equivalent per week     Comment: occ, 2-3 drinks    OB History    No data available     Review of Systems  Constitutional: Negative for fever.  HENT: Negative for sore throat.   Respiratory: Positive for cough, chest tightness and wheezing. Negative for shortness of breath.   Cardiovascular: Negative for leg swelling.  All other systems reviewed and are  negative.     Allergies  Peanuts and Sulfa antibiotics  Home Medications   Prior to Admission medications   Medication Sig Start Date End Date Taking? Authorizing Provider  albuterol (PROVENTIL HFA;VENTOLIN HFA) 108 (90 BASE) MCG/ACT inhaler Inhale 2 puffs into the lungs every 6 (six) hours as needed for wheezing or shortness of breath. 09/12/14   Francee Piccolo, PA-C  fluocinonide cream (LIDEX) 0.05 % Apply topically 2 (two) times daily. 02/03/13   Antony Madura, PA-C  Multiple Vitamins-Minerals (ALIVE WOMENS GUMMY) CHEW Chew 1 tablet by mouth daily at 12 noon.    Historical Provider, MD  predniSONE (DELTASONE) 20 MG tablet Take 2 tablets (40 mg total) by mouth daily. 09/12/14   Ramie Palladino, PA-C  PROAIR HFA 108 (90 BASE) MCG/ACT inhaler INHALE TWO PUFFS EVERY 6 HOURS AS NEEDED FOR WHEEZING  04/07/14   Damita Lack Kim, DO   BP 136/80 mmHg  Pulse 100  Temp(Src) 98.5 F (36.9 C) (Oral)  Resp 18  Wt 195 lb (88.451 kg)  SpO2 98%  LMP 08/17/2014 Physical Exam  Constitutional: She is oriented to person, place, and time. She appears well-developed and well-nourished. No distress.  HENT:  Head: Normocephalic and atraumatic.  Right Ear: External ear normal.  Left Ear: External ear normal.  Nose: Nose normal.  Mouth/Throat: Oropharynx is clear and moist. No oropharyngeal exudate.  Eyes: Conjunctivae are normal.  Neck: Neck supple.  Cardiovascular: Normal rate, regular rhythm and  normal heart sounds.   Pulmonary/Chest: Effort normal. No respiratory distress. She has wheezes (scant expiratory).  Slight prolonged expiratory phase  Abdominal: Soft. There is no tenderness.  Musculoskeletal: Normal range of motion. She exhibits no edema.  Neurological: She is alert and oriented to person, place, and time.  Skin: Skin is warm and dry. She is not diaphoretic.  Nursing note and vitals reviewed.   ED Course  Procedures (including critical care time) Medications  albuterol  (PROVENTIL) (2.5 MG/3ML) 0.083% nebulizer solution 5 mg (5 mg Nebulization Given 09/12/14 0004)  predniSONE (DELTASONE) tablet 40 mg (40 mg Oral Given 09/12/14 0022)  albuterol (PROVENTIL HFA;VENTOLIN HFA) 108 (90 BASE) MCG/ACT inhaler 2 puff (2 puffs Inhalation Given 09/12/14 0041)    Labs Review Labs Reviewed - No data to display  Imaging Review No results found.   EKG Interpretation None      MDM   Final diagnoses:  Asthma exacerbation    Filed Vitals:   09/12/14 0041  BP: 136/80  Pulse: 100  Temp:   Resp: 18   Patient presenting to the ED with asthma exacerbation. Pt alert, active, and oriented per age. PE showed expiratory wheeze with slightly prolonged expiratory phase. Albuterol nebulizer and prednisone given in the ED with resolution of wheezing. Oxygen saturations maintained above 92% in the ED. No evidence of respiratory distress, hypoxia, retractions, or accessory muscle use on re-evaluation. No indication for admission at this time. Will discharge patient home with inhaler (given in ED) and four more days of prednisone. Return precautions discussed. Patient is agreeable to plan. Patient is stable at time of discharge      Francee Piccolo, PA-C 09/12/14 0111  Gerhard Munch, MD 09/12/14 [id number]

## 2014-10-24 ENCOUNTER — Telehealth: Payer: Self-pay | Admitting: Family Medicine

## 2014-10-24 NOTE — Telephone Encounter (Signed)
Pt needs cpe and all your physical slots are at 3pm 4 pm and some at 1:15.  pls advise if ok to do labs in the am. thanks

## 2014-10-24 NOTE — Telephone Encounter (Signed)
Her labs looked good last year, so may not need any fasting labs. Please let her know we will order any labs we need at her visit. No need to come at another time. Thanks.

## 2014-10-25 NOTE — Telephone Encounter (Signed)
Pt aware. Had a cancellation on Monday 9/12 and pt will see you then!

## 2014-10-30 ENCOUNTER — Ambulatory Visit (INDEPENDENT_AMBULATORY_CARE_PROVIDER_SITE_OTHER): Payer: Self-pay | Admitting: Family Medicine

## 2014-10-30 DIAGNOSIS — R69 Illness, unspecified: Secondary | ICD-10-CM

## 2014-10-30 NOTE — Progress Notes (Signed)
NO SHOW

## 2014-10-31 ENCOUNTER — Encounter: Payer: Self-pay | Admitting: Family Medicine

## 2014-10-31 ENCOUNTER — Other Ambulatory Visit (HOSPITAL_COMMUNITY)
Admission: RE | Admit: 2014-10-31 | Discharge: 2014-10-31 | Disposition: A | Discharge status: Home / Self Care | Payer: 59 | Source: Ambulatory Visit | Attending: Family Medicine | Admitting: Family Medicine

## 2014-10-31 ENCOUNTER — Ambulatory Visit (INDEPENDENT_AMBULATORY_CARE_PROVIDER_SITE_OTHER): Payer: 59 | Admitting: Family Medicine

## 2014-10-31 VITALS — BP 100/80 | HR 102 | Temp 98.7°F | Ht 66.0 in | Wt 185.0 lb

## 2014-10-31 DIAGNOSIS — Z0000 Encounter for general adult medical examination without abnormal findings: Secondary | ICD-10-CM

## 2014-10-31 DIAGNOSIS — J351 Hypertrophy of tonsils: Secondary | ICD-10-CM

## 2014-10-31 DIAGNOSIS — N760 Acute vaginitis: Secondary | ICD-10-CM | POA: Diagnosis present

## 2014-10-31 DIAGNOSIS — Z113 Encounter for screening for infections with a predominantly sexual mode of transmission: Secondary | ICD-10-CM

## 2014-10-31 DIAGNOSIS — Z720 Tobacco use: Secondary | ICD-10-CM | POA: Diagnosis not present

## 2014-10-31 DIAGNOSIS — F17200 Nicotine dependence, unspecified, uncomplicated: Secondary | ICD-10-CM

## 2014-10-31 DIAGNOSIS — Z8709 Personal history of other diseases of the respiratory system: Secondary | ICD-10-CM

## 2014-10-31 NOTE — Patient Instructions (Signed)
BEFORE YOU LEAVE: -schedule follow up in 4 months  Please quit smoking  Call today for appointment with ENT to evaluate your throat  We recommend the following healthy lifestyle measures: - eat a healthy diet consisting  of vegetables, fruits, beans, nuts, seeds, healthy meats such as white chicken and fish and whole grains.  - avoid fried foods, fast food, processed foods, sodas, red meet and other fattening foods.  - get a least 150-300 minutes of aerobic exercise per week.   Use albuterol only as needed and call if symptoms and needing to use more then once per week

## 2014-10-31 NOTE — Progress Notes (Signed)
HPI:  Here for CPE:  -Concerns and/or follow up today:   Asthma: -chronic, mild, intermittent -report working at [employer] and taking alb prophylacticly before shifts, no symptoms  Smoker: -1ppw, on and off since she was 17 -R tonsil enlarged -no pain, sore throat, cough, dysphagia  Wants STI testing: -GC/Chlam/wet prep only -declined HIV/RPR -female partners, no new partners, no symptoms  -Diet:no t great  -Exercise: no regular exercise  -Taking folic acid, vitamin D or calcium: yes  -Diabetes and Dyslipidemia Screening: done 10/2013 and normal  -Hx of HTN: no  -Vaccines: flu vaccine due - refused  -pap history: normal 10/2013  -FDLMP: 10/10/14   -Alcohol, Tobacco, drug use: see social history  Review of Systems - no fevers, unintentional weight loss, vision loss, hearing loss, chest pain, sob, hemoptysis, melena, hematochezia, hematuria, genital discharge, changing or concerning skin lesions, bleeding, bruising, loc, thoughts of self harm or SI  Past Medical History  Diagnosis Date  . Asthma   . Eczema   . Allergy     Past Surgical History  Procedure Laterality Date  . Mouth surgery      Family History  Problem Relation Age of Onset  . Breast cancer Maternal Grandfather     Social History   Social History  . Marital Status: Single    Spouse Name: N/A  . Number of Children: N/A  . Years of Education: N/A   Occupational History  . overnight stoker at [employer]    Social History Main Topics  . Smoking status: Current Every Day Smoker -- 0.25 packs/day for 3 years    Types: Cigarettes  . Smokeless tobacco: Never Used     Comment: Pt stated she smoked intermittently for 3 years.   . Alcohol Use: 0.0 oz/week    0 Standard drinks or equivalent per week     Comment: occ, 2-3 drinks   . Drug Use: No  . Sexual Activity: No   Other Topics Concern  . None   Social History Narrative   Work or School: [employer]      Home Situation: lives with  sister      Spiritual Beliefs: none      Lifestyle: walking; poor diet              Current outpatient prescriptions:  .  albuterol (PROVENTIL HFA;VENTOLIN HFA) 108 (90 BASE) MCG/ACT inhaler, Inhale 2 puffs into the lungs every 6 (six) hours as needed for wheezing or shortness of breath., Disp: 1 Inhaler, Rfl: 2 .  fluocinonide cream (LIDEX) 0.05 %, Apply topically 2 (two) times daily., Disp: 30 g, Rfl: 0 .  predniSONE (DELTASONE) 20 MG tablet, Take 40 mg by mouth daily., Disp: , Rfl: 0 .  PROAIR HFA 108 (90 BASE) MCG/ACT inhaler, INHALE TWO PUFFS EVERY 6 HOURS AS NEEDED FOR WHEEZING , Disp: 8 g, Rfl: 0  EXAM:  Filed Vitals:   10/31/14 0932  BP: 100/80  Pulse: 102  Temp: 98.7 F (37.1 C)    GENERAL: vitals reviewed and listed below, alert, oriented, appears well hydrated and in no acute distress  HEENT: head atraumatic, PERRLA, normal appearance of eyes, ears, nose and mouth except for asymmetric R tonsillar hypertrophy. moist mucus membranes.  NECK: supple, no masses or lymphadenopathy  LUNGS: clear to auscultation bilaterally, no rales, rhonchi or wheeze  CV: HRRR, no peripheral edema or cyanosis, normal pedal pulses  BREAST: normal appearance - no lesions or discharge, on palpation normal breast tissue without any suspicious  masses  ABDOMEN: bowel sounds normal, soft, non tender to palpation, no masses, no rebound or guarding  GU: normal appearance of external genitalia - no lesions or masses, normal vaginal mucosa - no abnormal discharge, normal appearance of cervix - no lesions or abnormal discharge, no masses or tenderness on palpation of uterus and ovaries.  RECTAL: refused  SKIN: no rash or abnormal lesions  MS: normal gait, moves all extremities normally  NEURO: CN II-XII grossly intact, normal muscle strength and sensation to light touch on extremities  PSYCH: normal affect, pleasant and cooperative  ASSESSMENT AND PLAN:  Discussed the following  assessment and plan:  Visit for preventive health examination -see below  Hypertrophy of tonsil -given smoking hx advised eval with ENT and she agreed to call to schedule eval, number provided -quit smoking  History of asthma -stop alb, use prn and call if regular use needed  Tobacco use disorder -counseled 3-5 minutes, advised o quit -offered help -she wants to try quitting on her own as has in the past -follow up 3-4 months  -Discussed and advised all [place] preventive services health task force level A and B recommendations for age, sex and risks.  -Advised at least 150 minutes of exercise per week and a healthy diet low in saturated fats and sweets and consisting of fresh fruits and vegetables, lean meats such as fish and white chicken and whole grains.  -labs, studies and vaccines per orders this encounter  No orders of the defined types were placed in this encounter.    Patient advised to return to clinic immediately if symptoms worsen or persist or new concerns.  Patient Instructions  BEFORE YOU LEAVE: -schedule follow up in 4 months  Please quit smoking  Call today for appointment with ENT to evaluate your throat  We recommend the following healthy lifestyle measures: - eat a healthy diet consisting  of vegetables, fruits, beans, nuts, seeds, healthy meats such as white chicken and fish and whole grains.  - avoid fried foods, fast food, processed foods, sodas, red meet and other fattening foods.  - get a least 150-300 minutes of aerobic exercise per week.   Use albuterol only as needed and call if symptoms and needing to use more then once per week     No Follow-up on file.  Kriste Basque R.

## 2014-10-31 NOTE — Progress Notes (Signed)
Pre visit review using our clinic review tool, if applicable. No additional management support is needed unless otherwise documented below in the visit note. 

## 2014-10-31 NOTE — Addendum Note (Signed)
Addended by: Johnella Moloney on: 10/31/2014 10:32 AM   Modules accepted: Orders

## 2014-11-01 LAB — CERVICOVAGINAL ANCILLARY ONLY
CHLAMYDIA, DNA PROBE: NEGATIVE
Neisseria Gonorrhea: NEGATIVE
TRICH (WINDOWPATH): NEGATIVE

## 2014-11-03 LAB — CERVICOVAGINAL ANCILLARY ONLY: CANDIDA VAGINITIS: NEGATIVE

## 2014-11-06 MED ORDER — METRONIDAZOLE 500 MG PO TABS: 500.0000 mg | ORAL_TABLET | Freq: Two times a day (BID) | ORAL | Status: DC

## 2014-11-06 NOTE — Addendum Note (Signed)
Addended by: Johnella Moloney on: 11/06/2014 09:16 AM   Modules accepted: Orders

## 2014-12-12 ENCOUNTER — Emergency Department (HOSPITAL_COMMUNITY)
Admission: EM | Admit: 2014-12-12 | Discharge: 2014-12-12 | Disposition: A | Discharge status: Home / Self Care | Payer: 59 | Attending: Emergency Medicine | Admitting: Emergency Medicine

## 2014-12-12 ENCOUNTER — Emergency Department (HOSPITAL_COMMUNITY): Payer: 59

## 2014-12-12 ENCOUNTER — Encounter (HOSPITAL_COMMUNITY): Payer: Self-pay | Admitting: Emergency Medicine

## 2014-12-12 DIAGNOSIS — Z872 Personal history of diseases of the skin and subcutaneous tissue: Secondary | ICD-10-CM | POA: Diagnosis not present

## 2014-12-12 DIAGNOSIS — Y92019 Unspecified place in single-family (private) house as the place of occurrence of the external cause: Secondary | ICD-10-CM | POA: Insufficient documentation

## 2014-12-12 DIAGNOSIS — J45909 Unspecified asthma, uncomplicated: Secondary | ICD-10-CM | POA: Insufficient documentation

## 2014-12-12 DIAGNOSIS — S8011XA Contusion of right lower leg, initial encounter: Secondary | ICD-10-CM | POA: Diagnosis not present

## 2014-12-12 DIAGNOSIS — Y9389 Activity, other specified: Secondary | ICD-10-CM | POA: Diagnosis not present

## 2014-12-12 DIAGNOSIS — Z720 Tobacco use: Secondary | ICD-10-CM | POA: Diagnosis not present

## 2014-12-12 DIAGNOSIS — Y998 Other external cause status: Secondary | ICD-10-CM | POA: Diagnosis not present

## 2014-12-12 DIAGNOSIS — Z79899 Other long term (current) drug therapy: Secondary | ICD-10-CM | POA: Diagnosis not present

## 2014-12-12 DIAGNOSIS — R52 Pain, unspecified: Secondary | ICD-10-CM

## 2014-12-12 DIAGNOSIS — T07XXXA Unspecified multiple injuries, initial encounter: Secondary | ICD-10-CM

## 2014-12-12 DIAGNOSIS — S060X0A Concussion without loss of consciousness, initial encounter: Secondary | ICD-10-CM

## 2014-12-12 DIAGNOSIS — S0990XA Unspecified injury of head, initial encounter: Secondary | ICD-10-CM | POA: Diagnosis present

## 2014-12-12 MED ORDER — ALBUTEROL SULFATE HFA 108 (90 BASE) MCG/ACT IN AERS
2.0000 | INHALATION_SPRAY | Freq: Once | RESPIRATORY_TRACT | Status: AC
  Administered 2014-12-12: 2 via RESPIRATORY_TRACT
  Filled 2014-12-12: qty 6.7

## 2014-12-12 MED ORDER — IBUPROFEN 800 MG PO TABS: 800.0000 mg | ORAL_TABLET | Freq: Three times a day (TID) | ORAL | Status: DC | PRN
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
## 2015-02-22 ENCOUNTER — Encounter (HOSPITAL_COMMUNITY): Payer: Self-pay | Admitting: *Deleted

## 2015-02-22 ENCOUNTER — Emergency Department (HOSPITAL_COMMUNITY)
Admission: EM | Admit: 2015-02-22 | Discharge: 2015-02-22 | Disposition: A | Discharge status: Home / Self Care | Payer: 59 | Attending: Emergency Medicine | Admitting: Emergency Medicine

## 2015-02-22 DIAGNOSIS — Z7952 Long term (current) use of systemic steroids: Secondary | ICD-10-CM | POA: Diagnosis not present

## 2015-02-22 DIAGNOSIS — J45909 Unspecified asthma, uncomplicated: Secondary | ICD-10-CM | POA: Diagnosis present

## 2015-02-22 DIAGNOSIS — Z872 Personal history of diseases of the skin and subcutaneous tissue: Secondary | ICD-10-CM | POA: Insufficient documentation

## 2015-02-22 DIAGNOSIS — F17210 Nicotine dependence, cigarettes, uncomplicated: Secondary | ICD-10-CM | POA: Diagnosis not present

## 2015-02-22 DIAGNOSIS — Z792 Long term (current) use of antibiotics: Secondary | ICD-10-CM | POA: Insufficient documentation

## 2015-02-22 DIAGNOSIS — J45901 Unspecified asthma with (acute) exacerbation: Secondary | ICD-10-CM

## 2015-02-22 MED ORDER — PREDNISONE 20 MG PO TABS
60.0000 mg | ORAL_TABLET | Freq: Once | ORAL | Status: AC
  Administered 2015-02-22: 60 mg via ORAL
  Filled 2015-02-22: qty 3

## 2015-02-22 MED ORDER — ALBUTEROL SULFATE HFA 108 (90 BASE) MCG/ACT IN AERS
2.0000 | INHALATION_SPRAY | RESPIRATORY_TRACT | Status: DC | PRN
  Administered 2015-02-22: 2 via RESPIRATORY_TRACT
  Filled 2015-02-22: qty 6.7

## 2015-02-22 MED ORDER — IPRATROPIUM-ALBUTEROL 0.5-2.5 (3) MG/3ML IN SOLN
3.0000 mL | Freq: Once | RESPIRATORY_TRACT | Status: AC
  Administered 2015-02-22: 3 mL via RESPIRATORY_TRACT
  Filled 2015-02-22: qty 3

## 2015-02-22 MED ORDER — PREDNISONE 20 MG PO TABS: 40.0000 mg | ORAL_TABLET | Freq: Every day | ORAL | Status: DC

## 2015-02-22 NOTE — ED Provider Notes (Signed)
CSN: [id number]     Arrival date & time 02/22/15  0703 History   First MD Initiated Contact with Patient 02/22/15 [phone]     Chief Complaint  Patient presents with  . Asthma     (Consider location/radiation/quality/duration/timing/severity/associated sxs/prior Treatment) HPI Comments: Patient presents to the emergency department with chief complaint of asthma exacerbation. She states that she has been wheezing more than normal. States that she ran out of her rescue inhaler. She has been using this more frequently than normal. She denies any fevers, chills, or productive cough. Denies any other symptoms at this time. There are no aggravating or alleviating factors.  The history is provided by the patient. No language interpreter was used.    Past Medical History  Diagnosis Date  . Asthma   . Eczema   . Allergy    Past Surgical History  Procedure Laterality Date  . Mouth surgery     Family History  Problem Relation Age of Onset  . Breast cancer Maternal Grandfather    Social History  Substance Use Topics  . Smoking status: Current Every Day Smoker -- 0.25 packs/day for 3 years    Types: Cigarettes  . Smokeless tobacco: Never Used     Comment: Pt stated she smoked intermittently for 3 years.   . Alcohol Use: 0.0 oz/week    0 Standard drinks or equivalent per week     Comment: occ, 2-3 drinks    OB History    No data available     Review of Systems  Constitutional: Negative for fever and chills.  Respiratory: Positive for wheezing. Negative for shortness of breath.   Cardiovascular: Negative for chest pain.  Gastrointestinal: Negative for nausea, vomiting, diarrhea and constipation.  Genitourinary: Negative for dysuria.  All other systems reviewed and are negative.     Allergies  Peanuts and Sulfa antibiotics  Home Medications   Prior to Admission medications   Medication Sig Start Date End Date Taking? Authorizing Provider  fluocinonide cream (LIDEX) 0.05 %  Apply topically 2 (two) times daily. 02/03/13  Yes Antony MaduraKelly Humes, PA-C  ibuprofen (ADVIL,MOTRIN) 800 MG tablet Take 1 tablet (800 mg total) by mouth every 8 (eight) hours as needed for mild pain or moderate pain. 12/12/14  Yes Trixie DredgeEmily West, PA-C  metroNIDAZOLE (FLAGYL) 500 MG tablet Take 1 tablet (500 mg total) by mouth 2 (two) times daily. 11/06/14  Yes Terressa KoyanagiHannah R Kim, DO  albuterol (PROVENTIL HFA;VENTOLIN HFA) 108 (90 BASE) MCG/ACT inhaler Inhale 2 puffs into the lungs every 6 (six) hours as needed for wheezing or shortness of breath. 09/12/14   Francee PiccoloJennifer Piepenbrink, PA-C  predniSONE (DELTASONE) 20 MG tablet Take 2 tablets (40 mg total) by mouth daily. 02/22/15   Roxy Horsemanobert Rickiya Picariello, PA-C  PROAIR HFA 108 (90 BASE) MCG/ACT inhaler INHALE TWO PUFFS EVERY 6 HOURS AS NEEDED FOR WHEEZING  04/07/14   Terressa KoyanagiHannah R Kim, DO   BP 121/78 mmHg  Pulse 80  Temp(Src) 98.4 F (36.9 C) (Oral)  Resp 16  Ht 5\' 6"  (1.676 m)  Wt 83.915 kg  BMI 29.87 kg/m2  SpO2 97% Physical Exam  Constitutional: She is oriented to person, place, and time. She appears well-developed and well-nourished.  HENT:  Head: Normocephalic and atraumatic.  Eyes: Conjunctivae and EOM are normal. Pupils are equal, round, and reactive to light.  Neck: Normal range of motion. Neck supple.  Cardiovascular: Normal rate and regular rhythm.  Exam reveals no gallop and no friction rub.   No murmur heard.  Pulmonary/Chest: Effort normal. No respiratory distress. She has wheezes. She has no rales. She exhibits no tenderness.  Mild wheeze right lower lobe  Abdominal: Soft. Bowel sounds are normal. She exhibits no distension and no mass. There is no tenderness. There is no rebound and no guarding.  Musculoskeletal: Normal range of motion. She exhibits no edema or tenderness.  Neurological: She is alert and oriented to person, place, and time.  Skin: Skin is warm and dry.  Psychiatric: She has a normal mood and affect. Her behavior is normal. Judgment and thought  content normal.  Nursing note and vitals reviewed.   ED Course  Procedures (including critical care time)   MDM   Final diagnoses:  Asthma exacerbation    Patient feels improved after breathing treatment. Will discharge with rescue inhaler and prednisone. Patient is speaking clearly, no increased work of breathing, O2 saturation is normal.    Roxy Horseman, PA-C 02/22/15 [id number]  Lavera Guise, MD 02/22/15 [phone]

## 2015-02-22 NOTE — ED Notes (Signed)
Pt reports increased wheezing yesterday and ran out of her rescue neb. Pt is requesting a rescue neb. Pt resp even and unlabored .

## 2015-02-22 NOTE — Discharge Instructions (Signed)
Asthma, Adult Asthma is a recurring condition in which the airways tighten and narrow. Asthma can make it difficult to breathe. It can cause coughing, wheezing, and shortness of breath. Asthma episodes, also called asthma attacks, range from minor to life-threatening. Asthma cannot be cured, but medicines and lifestyle changes can help control it. CAUSES Asthma is believed to be caused by inherited (genetic) and environmental factors, but its exact cause is unknown. Asthma may be triggered by allergens, lung infections, or irritants in the air. Asthma triggers are different for each person. Common triggers include:   Animal dander.  Dust mites.  Cockroaches.  Pollen from trees or grass.  Mold.  Smoke.  Air pollutants such as dust, household cleaners, hair sprays, aerosol sprays, paint fumes, strong chemicals, or strong odors.  Cold air, weather changes, and winds (which increase molds and pollens in the air).  Strong emotional expressions such as crying or laughing hard.  Stress.  Certain medicines (such as aspirin) or types of drugs (such as beta-blockers).  Sulfites in foods and drinks. Foods and drinks that may contain sulfites include dried fruit, potato chips, and sparkling grape juice.  Infections or inflammatory conditions such as the flu, a cold, or an inflammation of the nasal membranes (rhinitis).  Gastroesophageal reflux disease (GERD).  Exercise or strenuous activity. SYMPTOMS Symptoms may occur immediately after asthma is triggered or many hours later. Symptoms include:  Wheezing.  Excessive nighttime or early morning coughing.  Frequent or severe coughing with a common cold.  Chest tightness.  Shortness of breath. DIAGNOSIS  The diagnosis of asthma is made by a review of your medical history and a physical exam. Tests may also be performed. These may include:  Lung function studies. These tests show how much air you breathe in and out.  Allergy  tests.  Imaging tests such as X-rays. TREATMENT  Asthma cannot be cured, but it can usually be controlled. Treatment involves identifying and avoiding your asthma triggers. It also involves medicines. There are 2 classes of medicine used for asthma treatment:   Controller medicines. These prevent asthma symptoms from occurring. They are usually taken every day.  Reliever or rescue medicines. These quickly relieve asthma symptoms. They are used as needed and provide short-term relief. Your health care provider will help you create an asthma action plan. An asthma action plan is a written plan for managing and treating your asthma attacks. It includes a list of your asthma triggers and how they may be avoided. It also includes information on when medicines should be taken and when their dosage should be changed. An action plan may also involve the use of a device called a peak flow meter. A peak flow meter measures how well the lungs are working. It helps you monitor your condition. HOME CARE INSTRUCTIONS   Take medicines only as directed by your health care provider. Speak with your health care provider if you have questions about how or when to take the medicines.  Use a peak flow meter as directed by your health care provider. Record and keep track of readings.  Understand and use the action plan to help minimize or stop an asthma attack without needing to seek medical care.  Control your home environment in the following ways to help prevent asthma attacks:  Do not smoke. Avoid being exposed to secondhand smoke.  Change your heating and air conditioning filter regularly.  Limit your use of fireplaces and wood stoves.  Get rid of pests (such as roaches   and mice) and their droppings.  Throw away plants if you see mold on them.  Clean your floors and dust regularly. Use unscented cleaning products.  Try to have someone else vacuum for you regularly. Stay out of rooms while they are  being vacuumed and for a short while afterward. If you vacuum, use a dust mask from a hardware store, a double-layered or microfilter vacuum cleaner bag, or a vacuum cleaner with a HEPA filter.  Replace carpet with wood, tile, or vinyl flooring. Carpet can trap dander and dust.  Use allergy-proof pillows, mattress covers, and box spring covers.  Wash bed sheets and blankets every week in hot water and dry them in a dryer.  Use blankets that are made of polyester or cotton.  Clean bathrooms and kitchens with bleach. If possible, have someone repaint the walls in these rooms with mold-resistant paint. Keep out of the rooms that are being cleaned and painted.  Wash hands frequently. SEEK MEDICAL CARE IF:   You have wheezing, shortness of breath, or a cough even if taking medicine to prevent attacks.  The colored mucus you cough up (sputum) is thicker than usual.  Your sputum changes from clear or white to yellow, green, gray, or bloody.  You have any problems that may be related to the medicines you are taking (such as a rash, itching, swelling, or trouble breathing).  You are using a reliever medicine more than 2-3 times per week.  Your peak flow is still at 50-79% of your personal best after following your action plan for 1 hour.  You have a fever. SEEK IMMEDIATE MEDICAL CARE IF:   You seem to be getting worse and are unresponsive to treatment during an asthma attack.  You are short of breath even at rest.  You get short of breath when doing very little physical activity.  You have difficulty eating, drinking, or talking due to asthma symptoms.  You develop chest pain.  You develop a fast heartbeat.  You have a bluish color to your lips or fingernails.  You are light-headed, dizzy, or faint.  Your peak flow is less than 50% of your personal best.   This information is not intended to replace advice given to you by your health care provider. Make sure you discuss any  questions you have with your health care provider.   Document Released: 02/03/2005 Document Revised: 10/25/2014 Document Reviewed: 09/02/2012 Elsevier Interactive Patient Education 2016 [employer]  

## 2015-02-22 NOTE — ED Notes (Signed)
Declined W/C at D/C and was escorted to lobby by RN. 

## 2015-03-05 ENCOUNTER — Ambulatory Visit (INDEPENDENT_AMBULATORY_CARE_PROVIDER_SITE_OTHER): Payer: 59 | Admitting: Family Medicine

## 2015-03-05 ENCOUNTER — Encounter: Payer: Self-pay | Admitting: Family Medicine

## 2015-03-05 VITALS — BP 110/74 | HR 84 | Temp 98.4°F | Ht 66.0 in | Wt 176.3 lb

## 2015-03-05 DIAGNOSIS — L309 Dermatitis, unspecified: Secondary | ICD-10-CM | POA: Diagnosis not present

## 2015-03-05 DIAGNOSIS — J4540 Moderate persistent asthma, uncomplicated: Secondary | ICD-10-CM | POA: Diagnosis not present

## 2015-03-05 DIAGNOSIS — Z9109 Other allergy status, other than to drugs and biological substances: Secondary | ICD-10-CM

## 2015-03-05 DIAGNOSIS — Z91048 Other nonmedicinal substance allergy status: Secondary | ICD-10-CM

## 2015-03-05 DIAGNOSIS — F17200 Nicotine dependence, unspecified, uncomplicated: Secondary | ICD-10-CM

## 2015-03-05 MED ORDER — BECLOMETHASONE DIPROPIONATE 40 MCG/ACT IN AERS: 2.0000 | INHALATION_SPRAY | Freq: Two times a day (BID) | RESPIRATORY_TRACT | Status: DC

## 2015-03-05 MED ORDER — ALBUTEROL SULFATE HFA 108 (90 BASE) MCG/ACT IN AERS: 2.0000 | INHALATION_SPRAY | Freq: Four times a day (QID) | RESPIRATORY_TRACT | Status: DC | PRN

## 2015-03-05 NOTE — Progress Notes (Signed)
Pre visit review using our clinic review tool, if applicable. No additional management support is needed unless otherwise documented below in the visit note. 

## 2015-03-05 NOTE — Patient Instructions (Signed)
BEFORE YOU LEAVE: -schedule follow up in 1-2 months  START: 1) QVAR (beclomethasone) 2 puffs twice daily every day to prevent asthma 2) Zyrtec once daily  Use the albuterol as needed  NO SMOKING  Follow up with your dermatologist

## 2015-03-05 NOTE — Progress Notes (Signed)
HPI:  Asthma: -chronic, in ER twice in last few months for this  -had run out of her albuterol -reports: has to use albuterol several times per week, has bad allergies and not taking anything for this -denies: SOB, DOE, CP currently  Smoker: -1ppw, on and off since she was 17 -R tonsil enlarged at last visit and advised ENT evaluation - she reports resolved and did not see ENT -no pain, sore throat, cough, dysphagia  Eczema: -sees dermatologist  ROS: See pertinent positives and negatives per HPI.  Past Medical History  Diagnosis Date  . Asthma   . Eczema   . Allergy     Past Surgical History  Procedure Laterality Date  . Mouth surgery      Family History  Problem Relation Age of Onset  . Breast cancer Maternal Grandfather     Social History   Social History  . Marital Status: Single    Spouse Name: N/A  . Number of Children: N/A  . Years of Education: N/A   Occupational History  . overnight stoker at [employer]    Social History Main Topics  . Smoking status: Current Some Day Smoker -- 0.25 packs/day for 3 years    Types: Cigarettes  . Smokeless tobacco: Never Used     Comment: Pt stated she smoked intermittently for 3 years.   . Alcohol Use: 0.0 oz/week    0 Standard drinks or equivalent per week     Comment: occ, 2-3 drinks   . Drug Use: No  . Sexual Activity: No   Other Topics Concern  . None   Social History Narrative   Work or School: [employer]      Home Situation: lives with sister      Spiritual Beliefs: none      Lifestyle: walking; poor diet              Current outpatient prescriptions:  .  albuterol (PROVENTIL HFA;VENTOLIN HFA) 108 (90 Base) MCG/ACT inhaler, Inhale 2 puffs into the lungs every 6 (six) hours as needed for wheezing or shortness of breath., Disp: 1 Inhaler, Rfl: 5 .  fluocinonide cream (LIDEX) 0.05 %, Apply topically 2 (two) times daily., Disp: 30 g, Rfl: 0 .  beclomethasone (QVAR) 40 MCG/ACT inhaler, Inhale 2 puffs  into the lungs 2 (two) times daily., Disp: 1 Inhaler, Rfl: 12  EXAM:  Filed Vitals:   03/05/15 0929  BP: 110/74  Pulse: 84  Temp: 98.4 F (36.9 C)    Body mass index is 28.47 kg/(m^2).  GENERAL: vitals reviewed and listed above, alert, oriented, appears well hydrated and in no acute distress  HEENT: atraumatic, conjunttiva clear, no obvious abnormalities on inspection of external nose and ears, normal appearance of ear canals and TMs, clear nasal congestion, mild post oropharyngeal erythema with PND, no tonsillar edema or exudate, no sinus TTP  NECK: no obvious masses on inspection  LUNGS: clear to auscultation bilaterally, no wheezes, rales or rhonchi, good air movement  CV: HRRR, no peripheral edema  MS: moves all extremities without noticeable abnormality  PSYCH: pleasant and cooperative, no obvious depression or anxiety  ASSESSMENT AND PLAN:  Discussed the following assessment and plan:  Asthma, moderate persistent, uncomplicated - Plan: beclomethasone (QVAR) 40 MCG/ACT inhaler  Environmental allergies  Eczema - sees dermatologist  Tobacco use disorder  -advised to quit smoking and offered help -start ICS -start antihistamin -alb refilled -advised to quit smoking and offered help < 3 minutes -follow up 1 month -Patient  advised to return or notify a doctor immediately if symptoms worsen or persist or new concerns arise.  Patient Instructions  BEFORE YOU LEAVE: -schedule follow up in 1-2 months  START: 1) QVAR (beclomethasone) 2 puffs twice daily every day to prevent asthma 2) Zyrtec once daily  Use the albuterol as needed  NO SMOKING  Follow up with your dermatologist     Connie Payne.

## 2015-05-04 ENCOUNTER — Encounter: Payer: Self-pay | Admitting: Family Medicine

## 2015-05-04 ENCOUNTER — Ambulatory Visit (INDEPENDENT_AMBULATORY_CARE_PROVIDER_SITE_OTHER): Payer: 59 | Admitting: Family Medicine

## 2015-05-04 VITALS — BP 118/80 | HR 81 | Temp 98.2°F | Ht 66.0 in | Wt 176.3 lb

## 2015-05-04 DIAGNOSIS — J4540 Moderate persistent asthma, uncomplicated: Secondary | ICD-10-CM | POA: Diagnosis not present

## 2015-05-04 DIAGNOSIS — H109 Unspecified conjunctivitis: Secondary | ICD-10-CM | POA: Insufficient documentation

## 2015-05-04 DIAGNOSIS — L309 Dermatitis, unspecified: Secondary | ICD-10-CM | POA: Insufficient documentation

## 2015-05-04 DIAGNOSIS — F17200 Nicotine dependence, unspecified, uncomplicated: Secondary | ICD-10-CM | POA: Diagnosis not present

## 2015-05-04 DIAGNOSIS — J309 Allergic rhinitis, unspecified: Secondary | ICD-10-CM | POA: Diagnosis not present

## 2015-05-04 MED ORDER — ERYTHROMYCIN 5 MG/GM OP OINT: 1.0000 "application " | TOPICAL_OINTMENT | Freq: Every day | OPHTHALMIC | Status: DC

## 2015-05-04 NOTE — Progress Notes (Signed)
Pre visit review using our clinic review tool, if applicable. No additional management support is needed unless otherwise documented below in the visit note. 

## 2015-05-04 NOTE — Patient Instructions (Addendum)
Before you leave: -Schedule follow-up in 3 months  Start the antibiotic ointment in the eyes once daily in the evening for 5-7 days.  The your dermatologist as planned. In the interim please use a good moisturizer such as Cerave a cream at least once daily and your steroid cream on patches.  Start Zyrtec once daily in the evening for your allergies. This is available over-the-counter. Continue your asthma medications. Follow up if your cold symptoms persist or worsen or you have any worsening of your asthma or allergies.   Quit smoking. Please let us know if you need help quitting.

## 2015-05-04 NOTE — Progress Notes (Signed)
HPI:  Asthma/Allergies: -chronic, uncontrolled at last visit -strated on qvar and zyrtec last visit and is doing much better, but is dealing with a "cold" this week, with nasal congestion, sneezing, and watery eyes and he did her albuterol several times this week for coughing  -denies: SOB, DOE, CP currently  Smoker: -1ppw, on and off since she was 17 -advised to quit -reports: Still smoking a very little bit, not ready to quit -no pain, sore throat, cough, dysphagia  Eczema: -sees dermatologist, reports long-standing since childhood -Did not schedule follow-up as advised at last visit -Does not use emollient on a regular basis -Now with some irritation around the corners of the eyes  ROS: See pertinent positives and negatives per HPI.  Past Medical History  Diagnosis Date  . Asthma   . Eczema   . Allergy     Past Surgical History  Procedure Laterality Date  . Mouth surgery      Family History  Problem Relation Age of Onset  . Breast cancer Maternal Grandfather     Social History   Social History  . Marital Status: Single    Spouse Name: N/A  . Number of Children: N/A  . Years of Education: N/A   Occupational History  . overnight stoker at [employer]    Social History Main Topics  . Smoking status: Current Some Day Smoker -- 0.25 packs/day for 3 years    Types: Cigarettes  . Smokeless tobacco: Never Used     Comment: Pt stated she smoked intermittently for 3 years.   . Alcohol Use: 0.0 oz/week    0 Standard drinks or equivalent per week     Comment: occ, 2-3 drinks   . Drug Use: No  . Sexual Activity: No   Other Topics Concern  . None   Social History Narrative   Work or School: [employer]      Home Situation: lives with sister      Spiritual Beliefs: none      Lifestyle: walking; poor diet              Current outpatient prescriptions:  .  albuterol (PROVENTIL HFA;VENTOLIN HFA) 108 (90 Base) MCG/ACT inhaler, Inhale 2 puffs into the lungs every  6 (six) hours as needed for wheezing or shortness of breath., Disp: 1 Inhaler, Rfl: 5 .  beclomethasone (QVAR) 40 MCG/ACT inhaler, Inhale 2 puffs into the lungs 2 (two) times daily., Disp: 1 Inhaler, Rfl: 12 .  fluocinonide cream (LIDEX) 0.05 %, Apply topically 2 (two) times daily., Disp: 30 g, Rfl: 0 .  erythromycin ophthalmic ointment, Place 1 application into both eyes at bedtime., Disp: 3.5 g, Rfl: 0  EXAM:  Filed Vitals:   05/04/15 0925  BP: 118/80  Pulse: 81  Temp: 98.2 F (36.8 C)    Body mass index is 28.47 kg/(m^2).  GENERAL: vitals reviewed and listed above, alert, oriented, appears well hydrated and in no acute distress  HEENT: atraumatic, conjunttiva clear mildly erythematous with discolored discharge, irritated skin in the corners of the eyes with puffy eyelids, clear nasal congestion  NECK: no obvious masses on inspection  LUNGS: clear to auscultation bilaterally, no wheezes, rales or rhonchi, good air movement  CV: HRRR, no peripheral edema  SKIN: Multiple raised thickened scaly plaques throughout  MS: moves all extremities without noticeable abnormality  PSYCH: pleasant and cooperative, no obvious depression or anxiety  ASSESSMENT AND PLAN:  Discussed the following assessment and plan:  Allergic rhinitis, unspecified allergic rhinitis type  Asthma, moderate persistent, uncomplicated  Bilateral conjunctivitis  Tobacco use disorder  Eczema  Advise dermatology follow-up for her skin and she scheduled this appointment while she was in our office today. In the interim advised a good emollient cream along with her steroid cream. Advise an antibiotic ointment for the eyes. Advise starting an antihistamine daily for allergies. Prompt follow-up if worsening allergies or asthma. Continue asthma regimen. She may also benefit from Singulair and may consider if symptoms worsen or persist. Advised to quit smoking and offered help. -Patient advised to return or  notify a doctor immediately if symptoms worsen or persist or new concerns arise.  Patient Instructions  Before you leave: -Schedule follow-up in 3 months  Start the antibiotic ointment in the eyes once daily in the evening for 5-7 days.  The your dermatologist as planned. In the interim please use a good moisturizer such as Cerave a cream at least once daily and your steroid cream on patches.  Start Zyrtec once daily in the evening for your allergies. This is available over-the-counter. Continue your asthma medications. Follow up if your cold symptoms persist or worsen or you have any worsening of your asthma or allergies.   Quit smoking. Please let us know if you need help quitting.      Kriste BasqueKIM, HANNAH R.

## 2015-06-15 ENCOUNTER — Other Ambulatory Visit: Payer: Self-pay | Admitting: Family Medicine

## 2015-08-06 ENCOUNTER — Ambulatory Visit (INDEPENDENT_AMBULATORY_CARE_PROVIDER_SITE_OTHER): Payer: 59 | Admitting: Family Medicine

## 2015-08-06 ENCOUNTER — Encounter: Payer: Self-pay | Admitting: Family Medicine

## 2015-08-06 VITALS — BP 120/78 | HR 89 | Temp 98.5°F | Ht 66.0 in | Wt 178.4 lb

## 2015-08-06 DIAGNOSIS — F17200 Nicotine dependence, unspecified, uncomplicated: Secondary | ICD-10-CM

## 2015-08-06 DIAGNOSIS — J309 Allergic rhinitis, unspecified: Secondary | ICD-10-CM

## 2015-08-06 DIAGNOSIS — J4540 Moderate persistent asthma, uncomplicated: Secondary | ICD-10-CM

## 2015-08-06 DIAGNOSIS — L309 Dermatitis, unspecified: Secondary | ICD-10-CM | POA: Diagnosis not present

## 2015-08-06 MED ORDER — MONTELUKAST SODIUM 10 MG PO TABS: 10.0000 mg | ORAL_TABLET | Freq: Every day | ORAL | Status: DC

## 2015-08-06 NOTE — Progress Notes (Signed)
HPI:  Follow up:  Asthma/Allergies: -chronic -started on qvar and zyrtec 2017 - taking daily, alb prn - using less, but still almost daily; also has nasal congestion -denies: SOB, DOE, CP currently -has eye issues as well and seeing opthomologist  Smoker: -2 ppw, on and off since she was 35 -advised to quit -reports: she knows it is "bad", she enjoys the physical aspects and "being part of the crowd"; she wants to quit 08/20/15 - but does not have a plan for this -no pain, sore throat, cough, dysphagia  Eczema: -sees dermatologist, Dr. Dorita Sciara office, reports long-standing since childhood -Per dermatologist is using topical steroids -Does not use emollient on a regular basis   ROS: See pertinent positives and negatives per HPI.  Past Medical History  Diagnosis Date  . Asthma   . Eczema   . Allergy     Past Surgical History  Procedure Laterality Date  . Mouth surgery      Family History  Problem Relation Age of Onset  . Breast cancer Maternal Grandfather     Social History   Social History  . Marital Status: Single    Spouse Name: N/A  . Number of Children: N/A  . Years of Education: N/A   Occupational History  . overnight stoker at [employer]    Social History Main Topics  . Smoking status: Current Some Day Smoker -- 0.25 packs/day for 3 years    Types: Cigarettes  . Smokeless tobacco: Never Used     Comment: Pt stated she smoked intermittently for 3 years.   . Alcohol Use: 0.0 oz/week    0 Standard drinks or equivalent per week     Comment: occ, 2-3 drinks   . Drug Use: No  . Sexual Activity: No   Other Topics Concern  . None   Social History Narrative   Work or School: [employer]      Home Situation: lives with sister      Spiritual Beliefs: none      Lifestyle: walking; poor diet              Current outpatient prescriptions:  .  beclomethasone (QVAR) 40 MCG/ACT inhaler, Inhale 2 puffs into the lungs 2 (two) times daily., Disp: 1 Inhaler,  Rfl: 12 .  fluocinonide cream (LIDEX) 0.05 %, Apply topically 2 (two) times daily., Disp: 30 g, Rfl: 0 .  PROAIR HFA 108 (90 Base) MCG/ACT inhaler, INHALE TWO PUFFS BY MOUTH EVERY SIX HOURS AS NEEDED FOR WHEEZING, Disp: 8.5 Inhaler, Rfl: 2 .  montelukast (SINGULAIR) 10 MG tablet, Take 1 tablet (10 mg total) by mouth at bedtime., Disp: 30 tablet, Rfl: 3  EXAM:  Filed Vitals:   08/06/15 0836  BP: 120/78  Pulse: 89  Temp: 98.5 F (36.9 C)    Body mass index is 28.81 kg/(m^2).  GENERAL: vitals reviewed and listed above, alert, oriented, appears well hydrated and in no acute distress  HEENT: atraumatic, conjunttiva clear, no obvious abnormalities on inspection of external nose and ears  NECK: no obvious masses on inspection  LUNGS: clear to auscultation bilaterally, no wheezes, rales or rhonchi, good air movement  CV: HRRR, no peripheral edema  MS: moves all extremities without noticeable abnormality  SKIN: scattered scaly plaques  PSYCH: pleasant and cooperative, no obvious depression or anxiety  ASSESSMENT AND PLAN:  Discussed the following assessment and plan:  Allergic rhinitis, unspecified allergic rhinitis type  Asthma, moderate persistent, uncomplicated  Tobacco use disorder  Eczema  -smoking cessation  counseling 5 minutes, advised to quit, offered help, she is going plan to quit on her bday and chew gum or nicotine gum when gets cravings -add singulair to asthma/allergies regimen, discussed proper use and dosing of her asthma medications maintenance vs prn meds -advised follow up with her dermatologist and opthomologist -CPE in 3-4 months -Patient advised to return or notify a doctor immediately if symptoms worsen or persist or new concerns arise.  Patient Instructions  Quit smoking. Congratulations on wanting to quit smoking! You can do this!  Add Singulair and take once daily.  Follow up with your eye doctor and your dermatologist.  Physical exam in 3-4  months.      Kriste BasqueKIM, Salayah Meares R.

## 2015-08-06 NOTE — Patient Instructions (Addendum)
Quit smoking. Congratulations on wanting to quit smoking! You can do this!  Add Singulair and take once daily.  Follow up with your eye doctor and your dermatologist.  Physical exam in 3-4 months.

## 2015-08-06 NOTE — Progress Notes (Signed)
Pre visit review using our clinic review tool, if applicable. No additional management support is needed unless otherwise documented below in the visit note. 

## 2015-10-04 ENCOUNTER — Other Ambulatory Visit: Payer: Self-pay | Admitting: Emergency Medicine

## 2015-10-04 MED ORDER — ALBUTEROL SULFATE HFA 108 (90 BASE) MCG/ACT IN AERS: INHALATION_SPRAY | RESPIRATORY_TRACT | 2 refills | Status: DC

## 2015-12-05 NOTE — Progress Notes (Deleted)
HPI:  Here for CPE:  -Concerns and/or follow up today:   Asthma: -chronic, mild, intermittent -report working at [employer] and taking alb prophylacticly before shifts, no symptoms  Smoker: -1ppw, on and off since she was 17 -R tonsil enlarged -no pain, sore throat, cough, dysphagia  Wants STI testing:  -Diet:  -Exercise:   -Taking folic acid, vitamin D or calcium: yes  -Diabetes and Dyslipidemia Screening: done 10/2013 and normal  -Hx of HTN: no  -Vaccines: flu vaccine due - refused  -pap history: normal 10/2013  -FDLMP:    -Alcohol, Tobacco, drug use: see social history  Review of Systems - no fevers, unintentional weight loss, vision loss, hearing loss, chest pain, sob, hemoptysis, melena, hematochezia, hematuria, genital discharge, changing or concerning skin lesions, bleeding, bruising, loc, thoughts of self harm or SI  Past Medical History:  Diagnosis Date  . Allergy   . Asthma   . Eczema     Past Surgical History:  Procedure Laterality Date  . MOUTH SURGERY      Family History  Problem Relation Age of Onset  . Breast cancer Maternal Grandfather     Social History   Social History  . Marital status: Single    Spouse name: N/A  . Number of children: N/A  . Years of education: N/A   Occupational History  . overnight stoker at [employer]    Social History Main Topics  . Smoking status: Current Some Day Smoker    Packs/day: 0.25    Years: 3.00    Types: Cigarettes  . Smokeless tobacco: Never Used     Comment: Pt stated she smoked intermittently for 3 years.   . Alcohol use 0.0 oz/week     Comment: occ, 2-3 drinks   . Drug use: No  . Sexual activity: No   Other Topics Concern  . Not on file   Social History Narrative   Work or School: [employer]      Home Situation: lives with sister      Spiritual Beliefs: none      Lifestyle: walking; poor diet              Current Outpatient Prescriptions:  .  albuterol  (PROAIR HFA) 108 (90 Base) MCG/ACT inhaler, INHALE TWO PUFFS BY MOUTH EVERY SIX HOURS AS NEEDED FOR WHEEZING, Disp: 8.5 Inhaler, Rfl: 2 .  beclomethasone (QVAR) 40 MCG/ACT inhaler, Inhale 2 puffs into the lungs 2 (two) times daily., Disp: 1 Inhaler, Rfl: 12 .  fluocinonide cream (LIDEX) 0.05 %, Apply topically 2 (two) times daily., Disp: 30 g, Rfl: 0 .  montelukast (SINGULAIR) 10 MG tablet, Take 1 tablet (10 mg total) by mouth at bedtime., Disp: 30 tablet, Rfl: 3  EXAM:  There were no vitals filed for this visit.  GENERAL: vitals reviewed and listed below, alert, oriented, appears well hydrated and in no acute distress  HEENT: head atraumatic, PERRLA, normal appearance of eyes, ears, nose and mouth. moist mucus membranes.  NECK: supple, no masses or lymphadenopathy  LUNGS: clear to auscultation bilaterally, no rales, rhonchi or wheeze  CV: HRRR, no peripheral edema or cyanosis, normal pedal pulses  BREAST: normal appearance - no lesions or discharge, on palpation normal breast tissue without any suspicious masses  ABDOMEN: bowel sounds normal, soft, non tender to palpation, no masses, no rebound or guarding  GU: normal appearance of external genitalia - no lesions or masses, normal vaginal mucosa - no abnormal discharge, normal appearance of cervix - no lesions  or abnormal discharge, no masses or tenderness on palpation of uterus and ovaries.  RECTAL: refused  SKIN: no rash or abnormal lesions  MS: normal gait, moves all extremities normally  NEURO: normal gait, speech and thought processing grossly intact, muscle tone grossly intact throughout  PSYCH: normal affect, pleasant and cooperative  ASSESSMENT AND PLAN:  Discussed the following assessment and plan:  There are no diagnoses linked to this encounter.  -Discussed and advised all [place] preventive services health task force level A and B recommendations for age, sex and risks.  -Advised at least 150 minutes of exercise per  week and a healthy diet with avoidance of (less then 1 serving per week) processed foods, white starches, red meat, fast foods and sweets and consisting of: * 5-9 servings of fresh fruits and vegetables (not corn or potatoes) *nuts and seeds, beans *olives and olive oil *lean meats such as fish and white chicken  *whole grains  -labs, studies and vaccines per orders this encounter  No orders of the defined types were placed in this encounter.   Patient advised to return to clinic immediately if symptoms worsen or persist or new concerns.  There are no Patient Instructions on file for this visit.  No Follow-up on file.  Kriste BasqueKIM, HANNAH R., DO

## 2015-12-06 ENCOUNTER — Telehealth: Payer: Self-pay | Admitting: *Deleted

## 2015-12-06 ENCOUNTER — Encounter: Payer: BLUE CROSS/BLUE SHIELD | Admitting: Family Medicine

## 2015-12-06 DIAGNOSIS — Z0289 Encounter for other administrative examinations: Secondary | ICD-10-CM

## 2015-12-06 NOTE — Telephone Encounter (Signed)
Patient was a no-show for today's visit.  I called to check on her since she missed the appt today and she stated she forgot to call to cancel and will call back next week to reschedule.

## 2016-02-02 ENCOUNTER — Other Ambulatory Visit: Payer: Self-pay | Admitting: Family Medicine

## 2016-04-14 ENCOUNTER — Encounter (HOSPITAL_COMMUNITY): Payer: Self-pay | Admitting: Emergency Medicine

## 2016-04-14 ENCOUNTER — Emergency Department (HOSPITAL_COMMUNITY)
Admission: EM | Admit: 2016-04-14 | Discharge: 2016-04-14 | Disposition: A | Discharge status: Home / Self Care | Payer: BLUE CROSS/BLUE SHIELD | Attending: Emergency Medicine | Admitting: Emergency Medicine

## 2016-04-14 DIAGNOSIS — F17210 Nicotine dependence, cigarettes, uncomplicated: Secondary | ICD-10-CM | POA: Insufficient documentation

## 2016-04-14 DIAGNOSIS — Z91010 Allergy to peanuts: Secondary | ICD-10-CM | POA: Insufficient documentation

## 2016-04-14 DIAGNOSIS — J4520 Mild intermittent asthma, uncomplicated: Secondary | ICD-10-CM | POA: Insufficient documentation

## 2016-04-14 MED ORDER — ALBUTEROL SULFATE HFA 108 (90 BASE) MCG/ACT IN AERS: 2.0000 | INHALATION_SPRAY | RESPIRATORY_TRACT | 2 refills | Status: DC | PRN

## 2016-04-14 NOTE — ED Notes (Signed)
Pt stable, understands discharge instructions, and reasons for return.   

## 2016-04-14 NOTE — ED Triage Notes (Signed)
Pt reports feeling sob from her asthma since this am. Pt states she is out of her inhaler. resp e/u, pt speaking in clear sentences, NAD.

## 2016-04-14 NOTE — ED Notes (Signed)
Pt coming in ongoing asthma symptoms starting today. Pt lungs clear bilaterally. States she does not have an inhaler at home.

## 2016-04-14 NOTE — Discharge Instructions (Signed)
It was our pleasure to provide your ER care today - we hope that you feel better.  Use albuterol inhaler as need if wheezing.  Avoid any smoking.  Follow up with primary care doctor in the next 1-2 weeks.  Return to ER if worse, new symptoms, fevers, trouble breathing, chest pain, other concern.

## 2016-04-14 NOTE — ED Provider Notes (Signed)
[facility]PT Provider Note   CSN: [id number] Arrival date & time: 04/14/16  1443  By signing my name below, I, Marnette Burgess Long, attest that this documentation has been prepared under the direction and in the presence of Cathren Laine, MD . Electronically Signed: Marnette Burgess Long, Scribe. 04/14/2016. 4:56 PM.   History   Chief Complaint Chief Complaint  Patient presents with  . Asthma   The history is provided by the patient. No language interpreter was used.    HPI Comments:  Connie Payne is a 26 y.o. female with a PMHx of Asthma and Eczema, who presents to the Emergency Department complaining of SOB onset this morning. She reports waking up this morning feeling SOB and having intermittent "asthma attacks all day". She has associated symptoms of cough and rhinorrhea that has since resolved. She reports feeling fine now. She additionally reports she is out of her albuterol inhaler that she takes as needed with mild relief of her symptoms and she also takes Qvar for her lungs at home daily. No recent hospitalizations for asthma (last stated occurrence 2001.) Pt denies fever, sore throat, leg swelling, and any other associated symptoms at this time. Pt is a current some day smoker.   Past Medical History:  Diagnosis Date  . Allergy   . Asthma   . Eczema     Patient Active Problem List   Diagnosis Date Noted  . Rhinitis, allergic 05/04/2015  . Asthma, moderate persistent 05/04/2015  . Tobacco use disorder 05/04/2015  . Eczema 05/04/2015    Past Surgical History:  Procedure Laterality Date  . MOUTH SURGERY      OB History    No data available       Home Medications    Prior to Admission medications   Medication Sig Start Date End Date Taking? Authorizing Provider  beclomethasone (QVAR) 40 MCG/ACT inhaler Inhale 2 puffs into the lungs 2 (two) times daily. 03/05/15   Terressa Koyanagi, DO  fluocinonide cream (LIDEX) 0.05 % Apply topically 2 (two) times daily.  02/03/13   Antony Madura, PA-C  montelukast (SINGULAIR) 10 MG tablet Take 1 tablet (10 mg total) by mouth at bedtime. 08/06/15   Terressa Koyanagi, DO  PROAIR HFA 108 [phone] Base) MCG/ACT inhaler INHALE 2 PUFFS BY MOUTH EVERY 6 HOURS AS NEEDED WHEEZING 02/04/16   Terressa Koyanagi, DO    Family History Family History  Problem Relation Age of Onset  . Breast cancer Maternal Grandfather     Social History Social History  Substance Use Topics  . Smoking status: Current Some Day Smoker    Packs/day: 0.25    Years: 3.00    Types: Cigarettes  . Smokeless tobacco: Never Used     Comment: Pt stated she smoked intermittently for 3 years.   . Alcohol use 0.0 oz/week     Comment: occ, 2-3 drinks      Allergies   Peanuts [peanut oil] and Sulfa antibiotics   Review of Systems Review of Systems  Constitutional: Negative for fever.  HENT: Positive for rhinorrhea (resolved). Negative for sore throat.   Respiratory: Positive for cough and shortness of breath.   Cardiovascular: Negative for chest pain and leg swelling.  Gastrointestinal: Negative for vomiting.     Physical Exam Updated Vital Signs BP 124/79   Pulse 88   Temp 98.7 F (37.1 C) (Oral)   Resp 16   Ht 5\' 6"  (1.676 m)   Wt 157 lb (71.2 kg)  LMP 03/27/2016 (Exact Date)   SpO2 99%   BMI 25.34 kg/m   Physical Exam  Constitutional: She appears well-developed and well-nourished.  HENT:  Head: Normocephalic.  Nose: Nose normal.  Mouth/Throat: Oropharynx is clear and moist.  Eyes: Conjunctivae are normal.  Cardiovascular: Normal rate, regular rhythm, normal heart sounds and intact distal pulses.  Exam reveals no gallop and no friction rub.   No murmur heard. Pulmonary/Chest: Effort normal and breath sounds normal. No respiratory distress. She has no wheezes.  Abdominal: Soft. She exhibits no distension. There is no tenderness.  Musculoskeletal: She exhibits no edema or tenderness.  Neurological: She is alert.  Skin: Skin is warm  and dry.  Psychiatric: She has a normal mood and affect.  Nursing note and vitals reviewed.    ED Treatments / Results  DIAGNOSTIC STUDIES:  Oxygen Saturation is 99% on RA, normal by my interpretation.    COORDINATION OF CARE:  4:46 PM Discussed treatment plan with pt at bedside including medication refill and pt agreed to plan.  Labs (all labs ordered are listed, but only abnormal results are displayed) Labs Reviewed - No data to display  EKG  EKG Interpretation None       Radiology No results found.  Procedures Procedures (including critical care time)  Medications Ordered in ED Medications - No data to display   Initial Impression / Assessment and Plan / ED Course  I have reviewed the triage vital signs and the nursing notes.  Pertinent labs & imaging results that were available during my care of the patient were reviewed by me and considered in my medical decision making (see chart for details).  Pt out of inhaler.   No current sob or wheezing.  Will give rx for mdi for home.  rec smoking cessation. pcp f/u.    Final Clinical Impressions(s) / ED Diagnoses   Final diagnoses:  None    New Prescriptions New Prescriptions   No medications on file   I personally performed the services described in this documentation, which was scribed in my presence. The recorded information has been reviewed and considered. Cathren LaineKevin Lilybelle Mayeda, MD     Cathren LaineKevin Darria Corvera, MD 04/14/16 [phone]

## 2016-05-20 ENCOUNTER — Emergency Department (HOSPITAL_COMMUNITY): Payer: Self-pay

## 2016-05-20 ENCOUNTER — Emergency Department (HOSPITAL_COMMUNITY)
Admission: EM | Admit: 2016-05-20 | Discharge: 2016-05-20 | Disposition: A | Discharge status: Home / Self Care | Payer: Self-pay | Attending: Emergency Medicine | Admitting: Emergency Medicine

## 2016-05-20 ENCOUNTER — Encounter (HOSPITAL_COMMUNITY): Payer: Self-pay | Admitting: Emergency Medicine

## 2016-05-20 DIAGNOSIS — F17210 Nicotine dependence, cigarettes, uncomplicated: Secondary | ICD-10-CM | POA: Insufficient documentation

## 2016-05-20 DIAGNOSIS — Z79899 Other long term (current) drug therapy: Secondary | ICD-10-CM | POA: Insufficient documentation

## 2016-05-20 DIAGNOSIS — J4521 Mild intermittent asthma with (acute) exacerbation: Secondary | ICD-10-CM | POA: Insufficient documentation

## 2016-05-20 LAB — POC URINE PREG, ED: PREG TEST UR: NEGATIVE

## 2016-05-20 MED ORDER — ALBUTEROL SULFATE HFA 108 (90 BASE) MCG/ACT IN AERS: 2.0000 | INHALATION_SPRAY | RESPIRATORY_TRACT | 2 refills | Status: DC | PRN

## 2016-05-20 MED ORDER — ALBUTEROL SULFATE HFA 108 (90 BASE) MCG/ACT IN AERS
2.0000 | INHALATION_SPRAY | Freq: Once | RESPIRATORY_TRACT | Status: AC
  Administered 2016-05-20: 2 via RESPIRATORY_TRACT
  Filled 2016-05-20: qty 6.7

## 2016-05-20 MED ORDER — IPRATROPIUM-ALBUTEROL 0.5-2.5 (3) MG/3ML IN SOLN
3.0000 mL | Freq: Once | RESPIRATORY_TRACT | Status: AC
  Administered 2016-05-20: 3 mL via RESPIRATORY_TRACT
  Filled 2016-05-20: qty 3

## 2016-05-20 NOTE — ED Provider Notes (Signed)
[facility] Provider Note   CSN: [id number] Arrival date & time: 05/20/16  1053   By signing my name below, I, Teofilo Pod, attest that this documentation has been prepared under the direction and in the presence of Graciella Freer, [place]. Electronically Signed: Teofilo Pod, ED Scribe. 05/20/2016. 11:41 AM.   History   Chief Complaint Chief Complaint  Patient presents with  . Asthma   The history is provided by the patient. No language interpreter was used.   HPI Comments:  Connie Payne is a 26 y.o. female with PMH/o asthma who presents to the Emergency Department complaining of SOB consistent with an ongoing asthma exacerbation since 0300 today. Pt complains of associated wheezing, dry cough, chest tightness. She states that symptoms feel similar to past episodes of asthma exacerbation. Patient states that she is prescribed albuterol inhaler but she has been out of it for a mont because she has been out. Pt denies any recent exposure to dust, mold, or dirt, and states that she does not have any typical triggers for asthma exacerbations. Denies fever, chills, nausea, vomiting, abdominal pain, chest pain.    Past Medical History:  Diagnosis Date  . Allergy   . Asthma   . Eczema     Patient Active Problem List   Diagnosis Date Noted  . Rhinitis, allergic 05/04/2015  . Asthma, moderate persistent 05/04/2015  . Tobacco use disorder 05/04/2015  . Eczema 05/04/2015    Past Surgical History:  Procedure Laterality Date  . MOUTH SURGERY      OB History    No data available       Home Medications    Prior to Admission medications   Medication Sig Start Date End Date Taking? Authorizing Provider  albuterol (PROVENTIL HFA;VENTOLIN HFA) 108 (90 Base) MCG/ACT inhaler Inhale 2 puffs into the lungs every 4 (four) hours as needed for wheezing or shortness of breath. 05/20/16   Maxwell Caul, PA-C  beclomethasone (QVAR) 40 MCG/ACT inhaler Inhale 2 puffs  into the lungs 2 (two) times daily. 03/05/15   Terressa Koyanagi, DO  fluocinonide cream (LIDEX) 0.05 % Apply topically 2 (two) times daily. 02/03/13   Antony Madura, PA-C  montelukast (SINGULAIR) 10 MG tablet Take 1 tablet (10 mg total) by mouth at bedtime. 08/06/15   Terressa Koyanagi, DO    Family History Family History  Problem Relation Age of Onset  . Breast cancer Maternal Grandfather     Social History Social History  Substance Use Topics  . Smoking status: Current Some Day Smoker    Packs/day: 0.25    Years: 3.00    Types: Cigarettes  . Smokeless tobacco: Never Used     Comment: Pt stated she smoked intermittently for 3 years.   . Alcohol use 0.0 oz/week     Comment: occ, 2-3 drinks      Allergies   Peanuts [peanut oil] and Sulfa antibiotics   Review of Systems Review of Systems  Constitutional: Negative for chills and fever.  Respiratory: Positive for cough, chest tightness, shortness of breath and wheezing.   Cardiovascular: Negative for chest pain.  Gastrointestinal: Negative for abdominal pain, constipation, diarrhea, nausea and vomiting.  Genitourinary: Negative for dysuria and hematuria.  Neurological: Negative for headaches.  All other systems reviewed and are negative.    Physical Exam Updated Vital Signs BP 122/80 (BP Location: Right Arm)   Pulse 87   Temp 98.8 F (37.1 C) (Oral)   Resp 18  LMP 05/20/2016   SpO2 100%   Physical Exam  Constitutional: She appears well-developed and well-nourished.  HENT:  Head: Normocephalic and atraumatic.  Eyes: Conjunctivae and EOM are normal. Right eye exhibits no discharge. Left eye exhibits no discharge. No scleral icterus.  Cardiovascular: Normal rate, regular rhythm and normal heart sounds.   Pulmonary/Chest: Effort normal. No tachypnea. No respiratory distress. She has wheezes. She has no rales.  Diffuse inspiratory and expiratory wheezing throughout. Good air movement. Able to speak in full sentences.    Musculoskeletal: She exhibits no deformity.  Neurological: She is alert.  Skin: Skin is warm and dry.  Psychiatric: She has a normal mood and affect. Her speech is normal and behavior is normal.     ED Treatments / Results  DIAGNOSTIC STUDIES:  Oxygen Saturation is 98% on RA, normal by my interpretation.    COORDINATION OF CARE:  Labs (all labs ordered are listed, but only abnormal results are displayed) Labs Reviewed  POC URINE PREG, ED    EKG  EKG Interpretation None       Radiology Dg Chest 2 View  Result Date: 05/20/2016 CLINICAL DATA:  Shortness of breath, wheezing cough, and chest tightness. History of asthma, current smoker. EXAM: CHEST  2 VIEW COMPARISON:  PA and lateral chest x-ray of May 27, 2014 FINDINGS: The lungs are mildly hyperinflated. There is no focal infiltrate. The interstitial markings are coarse though stable. The heart and pulmonary vascularity are normal. The mediastinum is normal in width. The bony thorax exhibits no acute abnormality. IMPRESSION: Mild hyperinflation consistent with known reactive airway disease. Mild interstitial prominence may reflect the patient's smoking history or acute bronchitic change. There is no alveolar pneumonia. Electronically Signed   By: David  [place] M.D.   On: 05/20/2016 12:28    Procedures Procedures (including critical care time)  Medications Ordered in ED Medications  ipratropium-albuterol (DUONEB) 0.5-2.5 (3) MG/3ML nebulizer solution 3 mL (3 mLs Nebulization Given 05/20/16 1148)  ipratropium-albuterol (DUONEB) 0.5-2.5 (3) MG/3ML nebulizer solution 3 mL (3 mLs Nebulization Given 05/20/16 1315)  albuterol (PROVENTIL HFA;VENTOLIN HFA) 108 (90 Base) MCG/ACT inhaler 2 puff (2 puffs Inhalation Given 05/20/16 1352)     Initial Impression / Assessment and Plan / ED Course  I have reviewed the triage vital signs and the nursing notes.  Pertinent labs & imaging results that were available during my care of the patient  were reviewed by me and considered in my medical decision making (see chart for details).     26 yo F with PMH/o asthma presents for SOB that began at 0300. Symptoms feel consistent with previous asthma exacerbations. Physical exam with diffuse inspiratory and expiratory wheezing throughout. No rales noted on lung exam. Consider asthma exacerbation, likely secondary to noncompliance vs bronchitis. Will give duoneb to help with wheezing. Will obtain CXR for eval of infectious etiology.   1:00 PM: Patient still with mild wheezing after 1st duoneb. Will repeat.   1:30 PM: 2nd duoneb complete. Wheezing resolved on lung exam. Patient reports feeling improved. O2 sats <95% on RA after duoneb. Will give albuterol inhaler while in the ED. Symptoms likely a resolve of asthma exacerbation, worsened by the fact that she did not have an albuterol inhaler. Stable for discharge.   Plan to discharge patient home with an albuterol inhaler in hand and will also give a prescription for albuterol inhaler. Instructed patient to follow-up with her doctor in 2 days. She states that she might not be able to see her  doctor due to insurance issue. Will provide patient with a list of clinics she can follow-up with. Return precautions discussed. Patient expresses understanding and agreement to plan.     Final Clinical Impressions(s) / ED Diagnoses   Final diagnoses:  Mild intermittent asthma with exacerbation    New Prescriptions Discharge Medication List as of 05/20/2016  1:44 PM    I personally performed the services described in this documentation, which was scribed in my presence. The recorded information has been reviewed and is accurate.      Maxwell Caul, PA-C 05/20/16 1733    Pricilla Loveless, MD 05/22/16 [phone]

## 2016-05-20 NOTE — ED Triage Notes (Signed)
Pt sts increased asthma sx x 1 week; pt sts hx of same but out of her inhaler at home

## 2016-05-20 NOTE — ED Notes (Signed)
Patient transported to X-ray 

## 2016-05-20 NOTE — Discharge Instructions (Signed)
°  Return to the Emergency Dept for any difficulty breathing, any worsening symptoms, any CP or any other worsening concerns.    If you do not have a primary care doctor you see regularly, please you the list below. Please call them to arrange for follow-up.    Follow-up with your primary care doctor in 2 days. Call their office and let them know you were seen in the ED.   No Primary Care Doctor Call [facility]  [phone] Other agencies that provide inexpensive medical care    [facility]  [phone]    [facility]  [phone]    [facility]  [phone]    [facility]  [phone]    [facility]  [phone]    [facility]  [phone]

## 2016-07-24 IMAGING — CR DG CHEST 2V
2 series · 2 of 2 positions shown · non-contrast
Comparison: 04/22/05

CLINICAL DATA: Right-sided chest pain.  Shortness breath

EXAM:
CHEST  2 VIEW

[w chest pa]
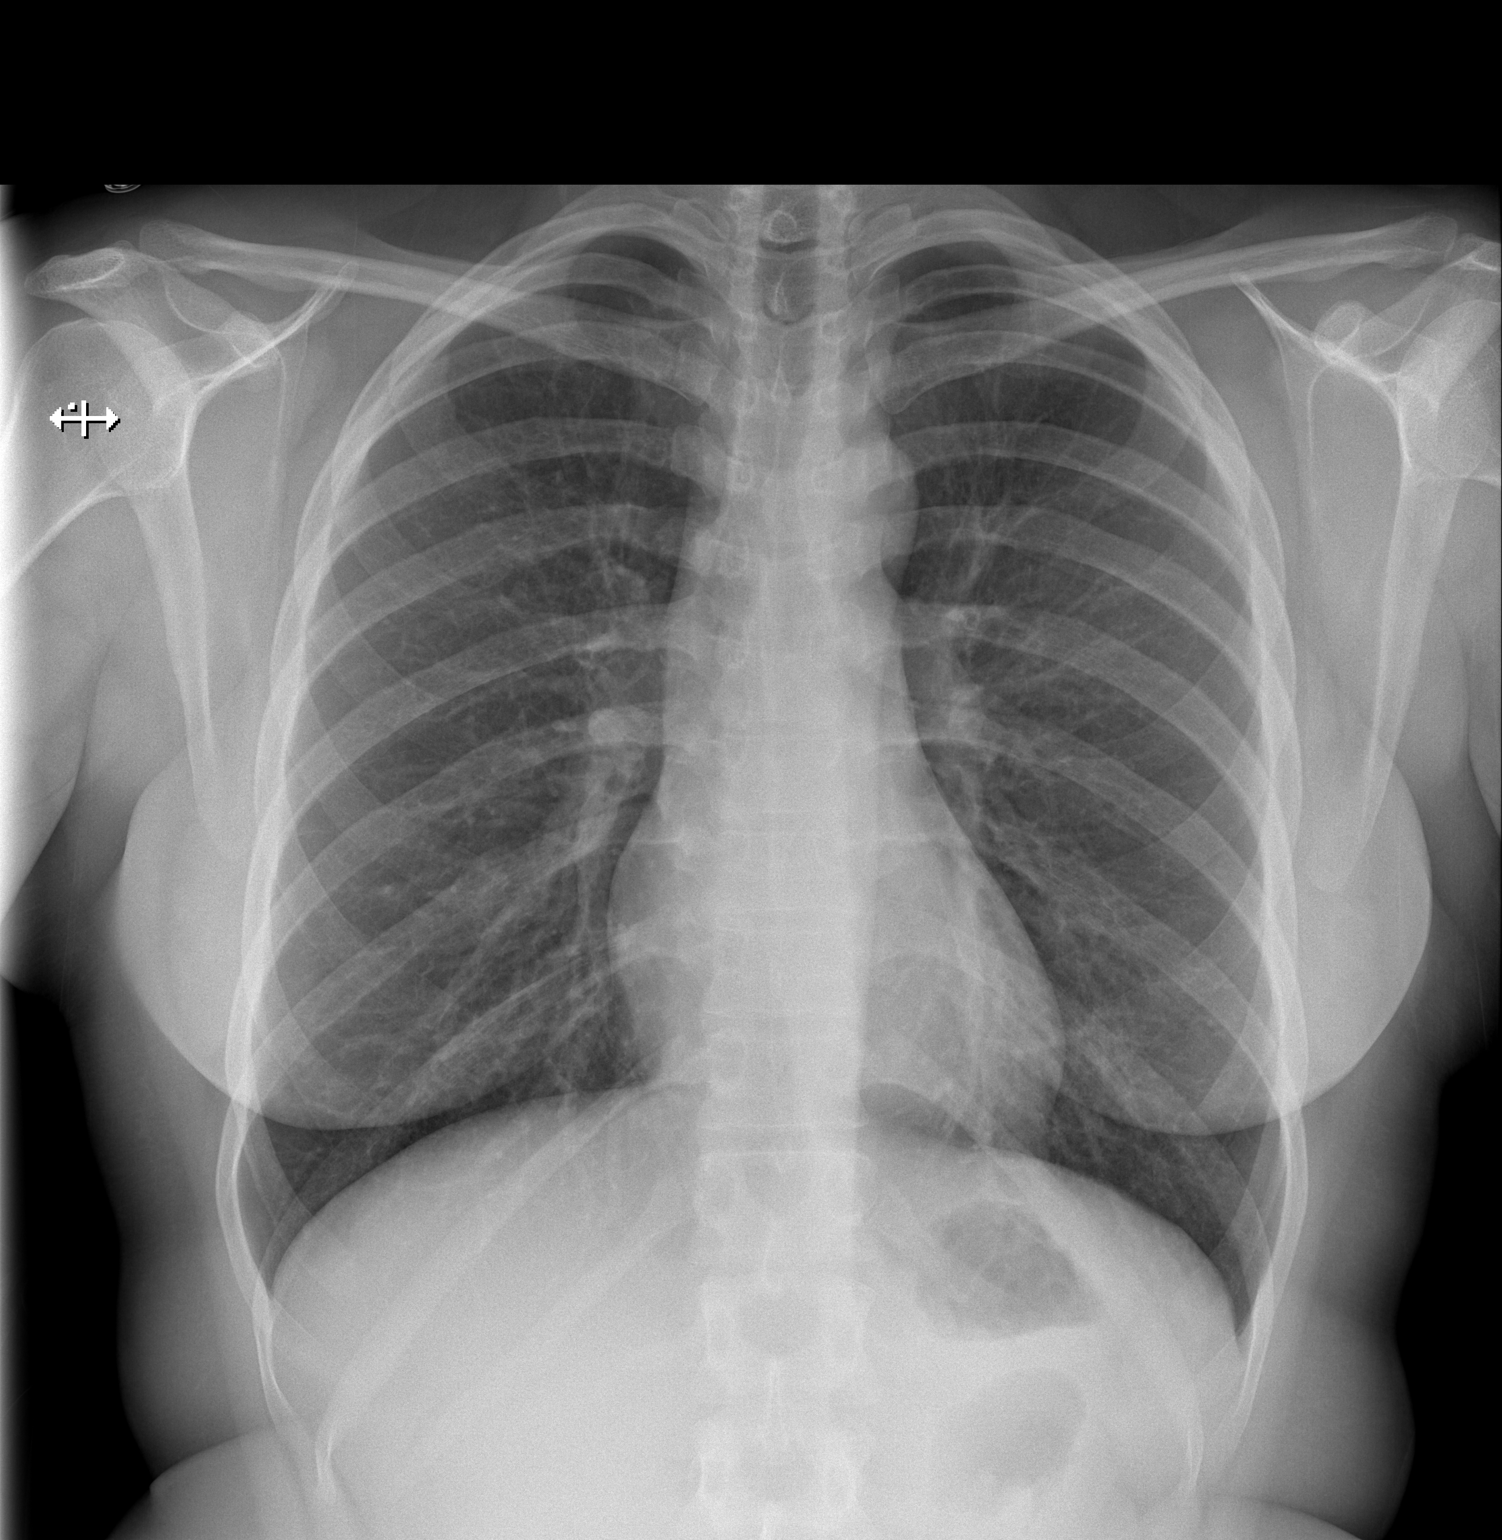

[w chest lat]
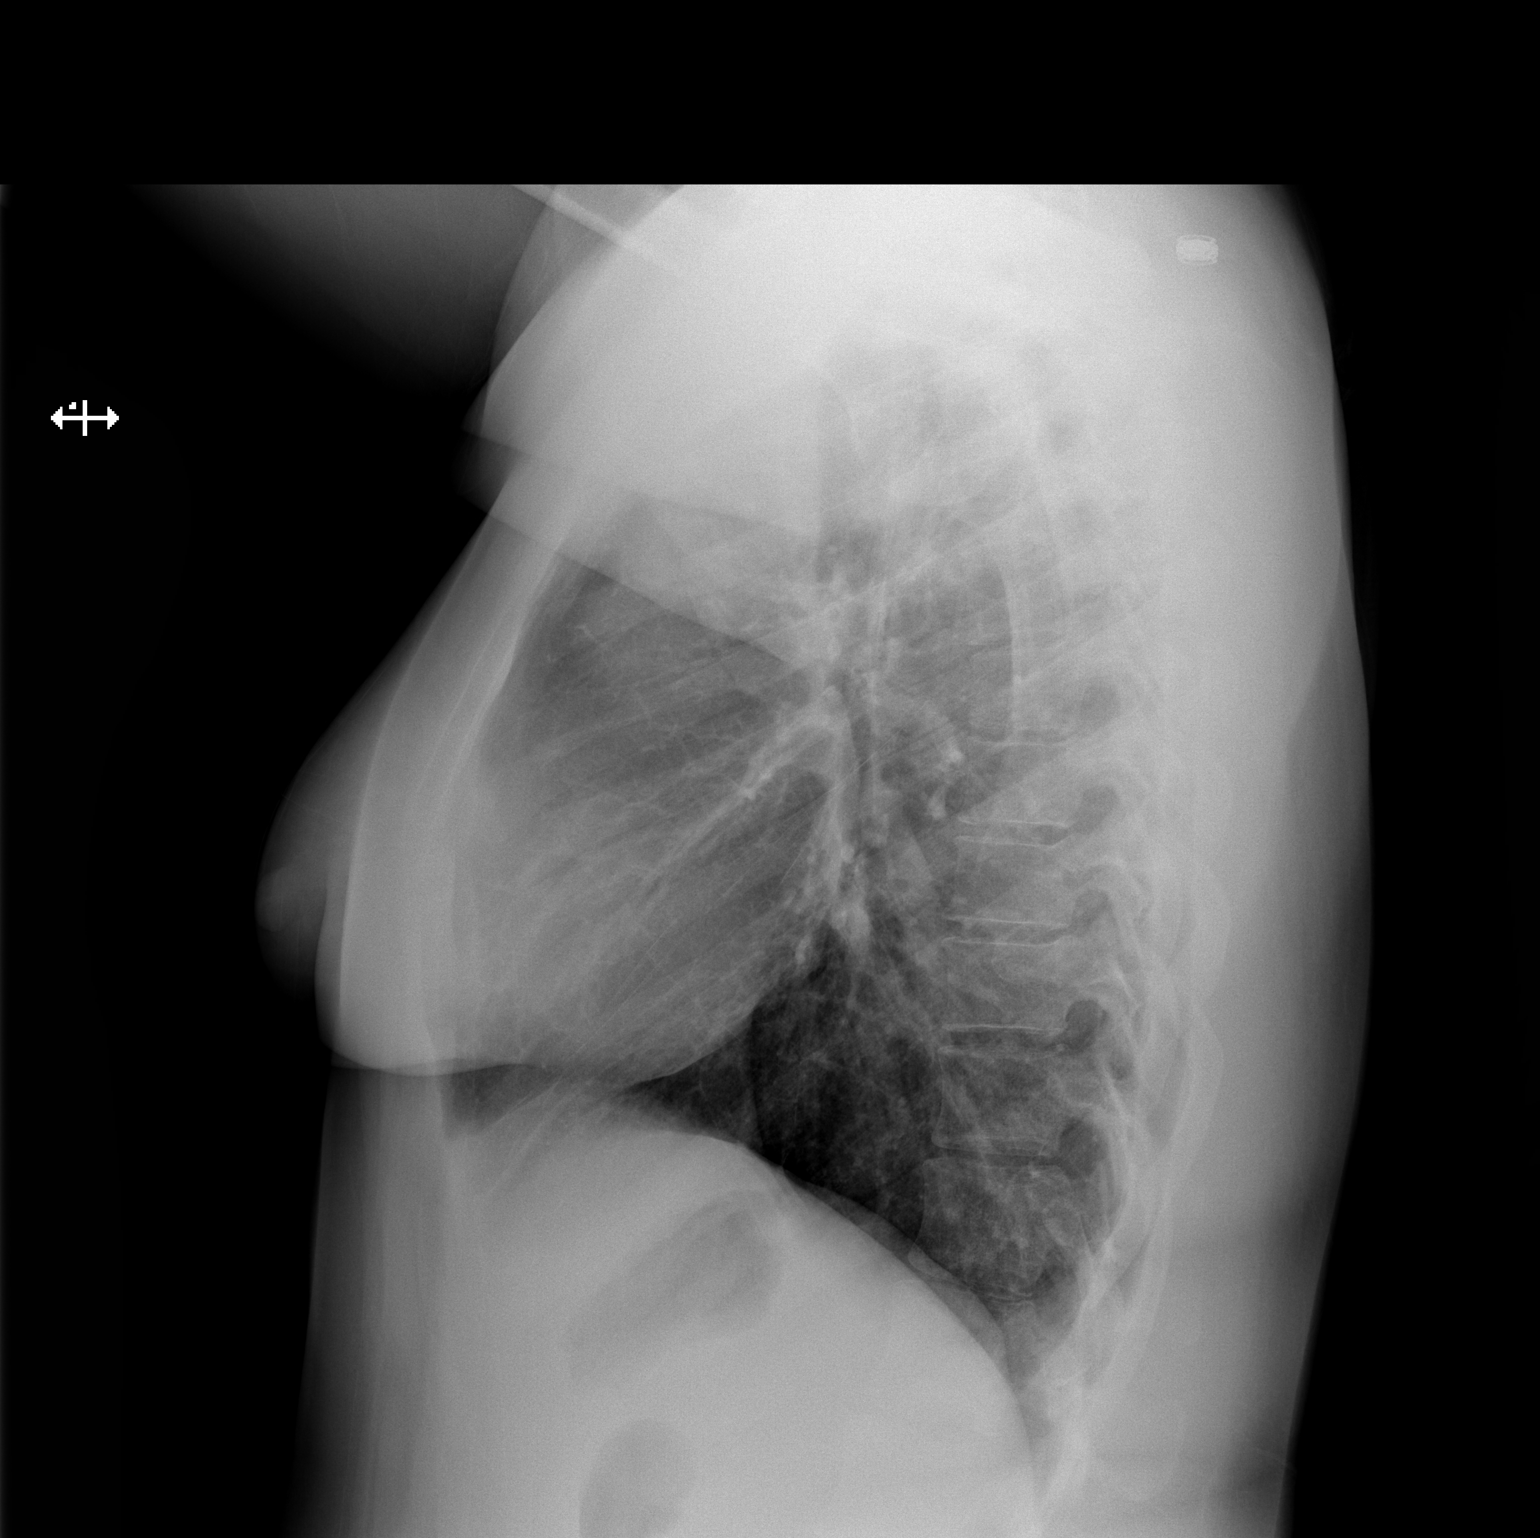

[2 of 2 positions shown; findings below may reference images not displayed]

FINDINGS: Normal mediastinum and cardiac silhouette. Normal pulmonary
vasculature. No evidence of effusion, infiltrate, or pneumothorax.
No acute bony abnormality.
IMPRESSION: Normal chest radiograph.

## 2016-11-14 ENCOUNTER — Encounter (HOSPITAL_COMMUNITY): Payer: Self-pay | Admitting: *Deleted

## 2016-11-14 ENCOUNTER — Emergency Department (HOSPITAL_COMMUNITY)
Admission: EM | Admit: 2016-11-14 | Discharge: 2016-11-14 | Disposition: A | Discharge status: Home / Self Care | Payer: BLUE CROSS/BLUE SHIELD | Attending: Emergency Medicine | Admitting: Emergency Medicine

## 2016-11-14 DIAGNOSIS — F17200 Nicotine dependence, unspecified, uncomplicated: Secondary | ICD-10-CM

## 2016-11-14 DIAGNOSIS — J301 Allergic rhinitis due to pollen: Secondary | ICD-10-CM

## 2016-11-14 DIAGNOSIS — Z91010 Allergy to peanuts: Secondary | ICD-10-CM | POA: Insufficient documentation

## 2016-11-14 DIAGNOSIS — F17210 Nicotine dependence, cigarettes, uncomplicated: Secondary | ICD-10-CM | POA: Insufficient documentation

## 2016-11-14 DIAGNOSIS — J4540 Moderate persistent asthma, uncomplicated: Secondary | ICD-10-CM

## 2016-11-14 DIAGNOSIS — L259 Unspecified contact dermatitis, unspecified cause: Secondary | ICD-10-CM | POA: Insufficient documentation

## 2016-11-14 DIAGNOSIS — L309 Dermatitis, unspecified: Secondary | ICD-10-CM

## 2016-11-14 DIAGNOSIS — J40 Bronchitis, not specified as acute or chronic: Secondary | ICD-10-CM

## 2016-11-14 MED ORDER — ALBUTEROL SULFATE HFA 108 (90 BASE) MCG/ACT IN AERS
2.0000 | INHALATION_SPRAY | RESPIRATORY_TRACT | Status: DC | PRN
  Administered 2016-11-14: 2 via RESPIRATORY_TRACT
  Filled 2016-11-14: qty 6.7

## 2016-11-14 MED ORDER — AZITHROMYCIN 250 MG PO TABS: ORAL_TABLET | ORAL | 0 refills | Status: DC

## 2016-11-14 MED ORDER — GUAIFENESIN-CODEINE 100-10 MG/5ML PO SOLN: 5.0000 mL | Freq: Every evening | ORAL | 0 refills | Status: DC | PRN

## 2016-11-14 MED ORDER — ALBUTEROL SULFATE HFA 108 (90 BASE) MCG/ACT IN AERS: 2.0000 | INHALATION_SPRAY | Freq: Four times a day (QID) | RESPIRATORY_TRACT | 2 refills | Status: DC | PRN

## 2016-11-14 NOTE — ED Provider Notes (Signed)
[facility]-EMERGENCY DEPT Provider Note   CSN: [id number] Arrival date & time: 11/14/16  1405   History   Chief Complaint Chief Complaint  Patient presents with  . Cough    HPI Connie Payne is a 26 y.o. female allergy, asthma, eczema, and current every day smoker. Hx of mouth surgery.  He is here with congestion, hot flashes and a severe cough. She was referred to Pulmonology in 2016 for her asthma. She has undergone allergy evaluation and is allergic to peanuts and pollen. She had a pulmonology function test 10/04/2013 which was completely normal.  She is out of her home inhaler. Denies having any series problems with her asthma but has been wheezing.  No le swelling, chest pain, N/V/D, weakness, fatigue.  HPI  Past Medical History:  Diagnosis Date  . Allergy   . Asthma   . Eczema     Patient Active Problem List   Diagnosis Date Noted  . Rhinitis, allergic 05/04/2015  . Asthma, moderate persistent 05/04/2015  . Tobacco use disorder 05/04/2015  . Eczema 05/04/2015    Past Surgical History:  Procedure Laterality Date  . MOUTH SURGERY      OB History    No data available       Home Medications    Prior to Admission medications   Medication Sig Start Date End Date Taking? Authorizing Provider  albuterol (PROVENTIL HFA;VENTOLIN HFA) 108 (90 Base) MCG/ACT inhaler Inhale 2 puffs into the lungs every 4 (four) hours as needed for wheezing or shortness of breath. 05/20/16   Maxwell Caul, PA-C  beclomethasone (QVAR) 40 MCG/ACT inhaler Inhale 2 puffs into the lungs 2 (two) times daily. 03/05/15   Terressa Koyanagi, DO  fluocinonide cream (LIDEX) 0.05 % Apply topically 2 (two) times daily. 02/03/13   Antony Madura, PA-C  montelukast (SINGULAIR) 10 MG tablet Take 1 tablet (10 mg total) by mouth at bedtime. 08/06/15   Terressa Koyanagi, DO    Family History Family History  Problem Relation Age of Onset  . Breast cancer Maternal Grandfather     Social History Social  History  Substance Use Topics  . Smoking status: Current Some Day Smoker    Packs/day: 0.25    Years: 3.00    Types: Cigarettes  . Smokeless tobacco: Never Used     Comment: Pt stated she smoked intermittently for 3 years.   . Alcohol use 0.0 oz/week     Comment: occ, 2-3 drinks      Allergies   Peanuts [peanut oil] and Sulfa antibiotics   Review of Systems Review of Systems  Negative ROS aside from pertinent positives and negatives as listed in HPI  Physical Exam Updated Vital Signs BP 122/85 (BP Location: Left Arm)   Pulse 92   Temp 98.6 F (37 C) (Oral)   Resp 18   SpO2 100%   Physical Exam  Constitutional: She appears well-developed and well-nourished.  HENT:  Head: Normocephalic and atraumatic.  Eyes: Pupils are equal, round, and reactive to light. Conjunctivae are normal.  Neck: Trachea normal, normal range of motion and full passive range of motion without pain. Neck supple.  Cardiovascular: Normal rate, regular rhythm and normal pulses.   Pulmonary/Chest: Effort normal and breath sounds normal. Chest wall is not dull to percussion. She exhibits no tenderness, no crepitus, no edema, no deformity and no retraction.  Mild expiratory wheezing Bronchitic cough  Abdominal: Soft. Normal appearance and bowel sounds are normal.  Musculoskeletal: Normal range of motion.  Neurological: She is alert. She has normal strength.  Skin: Skin is warm, dry and intact.  Psychiatric: She has a normal mood and affect. Her speech is normal and behavior is normal. Judgment and thought content normal. Cognition and memory are normal.     ED Treatments / Results  Labs (all labs ordered are listed, but only abnormal results are displayed) Labs Reviewed - No data to display  EKG  EKG Interpretation None       Radiology No results found.  Procedures Procedures (including critical care time)  Medications Ordered in ED Medications - No data to display   Initial  Impression / Assessment and Plan / ED Course  I have reviewed the triage vital signs and the nursing notes.  Pertinent labs & imaging results that were available during my care of the patient were reviewed by me and considered in my medical decision making (see chart for details).     Rx: Decongestant, z-pack and albuterol. Recommend she follow-up with her PCP  Blood pressure 122/85, pulse 92, temperature 98.6 F (37 C), temperature source Oral, resp. rate 18, SpO2 100 %.  Connie Payne has been evaluated today in the emergency department. The appropriate screening and testing was been performed and I believe the patient to be medically stable for discharge.   Return signs and symptoms have been discussed with the patient and/or caregivers and they have voiced their understanding. The patient has agreed to follow-up with their primary care provider or the referred specialist.    Final Clinical Impressions(s) / ED Diagnoses   Final diagnoses:  Tobacco use disorder  Moderate persistent asthma without complication  Eczema, unspecified type  Allergic rhinitis due to pollen, unspecified seasonality    New Prescriptions New Prescriptions   No medications on file     Marlon Pel, Cordelia Poche 11/14/16 1543    Arby Barrette, MD 11/15/16 1252

## 2016-11-14 NOTE — ED Triage Notes (Signed)
Pt is here with horrible cough, congestion, and had some hot flashes.

## 2017-02-27 ENCOUNTER — Emergency Department (HOSPITAL_COMMUNITY)
Admission: EM | Admit: 2017-02-27 | Discharge: 2017-02-27 | Disposition: A | Discharge status: Home / Self Care | Payer: Self-pay | Attending: Emergency Medicine | Admitting: Emergency Medicine

## 2017-02-27 ENCOUNTER — Encounter (HOSPITAL_COMMUNITY): Payer: Self-pay | Admitting: Emergency Medicine

## 2017-02-27 ENCOUNTER — Emergency Department (HOSPITAL_COMMUNITY): Payer: Self-pay

## 2017-02-27 DIAGNOSIS — Z79899 Other long term (current) drug therapy: Secondary | ICD-10-CM | POA: Insufficient documentation

## 2017-02-27 DIAGNOSIS — J069 Acute upper respiratory infection, unspecified: Secondary | ICD-10-CM | POA: Insufficient documentation

## 2017-02-27 DIAGNOSIS — F17210 Nicotine dependence, cigarettes, uncomplicated: Secondary | ICD-10-CM | POA: Insufficient documentation

## 2017-02-27 DIAGNOSIS — J45901 Unspecified asthma with (acute) exacerbation: Secondary | ICD-10-CM | POA: Insufficient documentation

## 2017-02-27 DIAGNOSIS — F410 Panic disorder [episodic paroxysmal anxiety] without agoraphobia: Secondary | ICD-10-CM | POA: Insufficient documentation

## 2017-02-27 DIAGNOSIS — Z91010 Allergy to peanuts: Secondary | ICD-10-CM | POA: Insufficient documentation

## 2017-02-27 MED ORDER — ALBUTEROL SULFATE HFA 108 (90 BASE) MCG/ACT IN AERS: 1.0000 | INHALATION_SPRAY | Freq: Four times a day (QID) | RESPIRATORY_TRACT | 0 refills | Status: DC | PRN

## 2017-02-27 MED ORDER — ALBUTEROL SULFATE (2.5 MG/3ML) 0.083% IN NEBU
5.0000 mg | INHALATION_SOLUTION | Freq: Once | RESPIRATORY_TRACT | Status: DC
  Filled 2017-02-27: qty 6

## 2017-02-27 MED ORDER — PREDNISONE 20 MG PO TABS: 40.0000 mg | ORAL_TABLET | Freq: Every day | ORAL | 0 refills | Status: DC

## 2017-02-27 MED ORDER — PREDNISONE 20 MG PO TABS
60.0000 mg | ORAL_TABLET | Freq: Once | ORAL | Status: AC
  Administered 2017-02-27: 60 mg via ORAL
  Filled 2017-02-27: qty 3

## 2017-02-27 MED ORDER — IPRATROPIUM BROMIDE 0.02 % IN SOLN
0.5000 mg | Freq: Once | RESPIRATORY_TRACT | Status: AC
  Administered 2017-02-27: 0.5 mg via RESPIRATORY_TRACT
  Filled 2017-02-27: qty 2.5

## 2017-02-27 MED ORDER — ALBUTEROL SULFATE HFA 108 (90 BASE) MCG/ACT IN AERS
2.0000 | INHALATION_SPRAY | RESPIRATORY_TRACT | Status: DC | PRN
  Administered 2017-02-27: 2 via RESPIRATORY_TRACT
  Filled 2017-02-27: qty 6.7

## 2017-02-27 MED ORDER — ALBUTEROL SULFATE (2.5 MG/3ML) 0.083% IN NEBU
5.0000 mg | INHALATION_SOLUTION | Freq: Once | RESPIRATORY_TRACT | Status: AC
  Administered 2017-02-27: 5 mg via RESPIRATORY_TRACT
  Filled 2017-02-27: qty 6

## 2017-02-27 NOTE — ED Triage Notes (Signed)
Patient reports asthma flared up since not feeling well and inhaler ran out yesterday. Patient adds that she has headache.

## 2017-02-27 NOTE — ED Provider Notes (Signed)
[facility]-EMERGENCY DEPT Provider Note   CSN: [id number] Arrival date & time: 02/27/17  4098     History   Chief Complaint Chief Complaint  Patient presents with  . Asthma    HPI Connie Payne is a 27 y.o. female.  Patient is a 27 year old female with a history of asthma, eczema and allergies presenting today with complaint of asthma exacerbation.  Patient states that yesterday she started to develop cold symptoms with runny nose, congestion and cough.  This morning that worsened into her wheezing and having shortness of breath.  She ran out of her inhaler yesterday so she had nothing to use to improve her breathing and she states she started having a panic attack.  Patient received 1 nebulizer in the waiting room before being bed and states she feels significantly better.   The history is provided by the patient.  URI   This is a new problem. The current episode started yesterday. The problem has been gradually worsening. There has been no fever. Associated symptoms include congestion, headaches, rhinorrhea, cough and wheezing. Treatments tried: home remedies but ran out of inhaler yesterday. The treatment provided no relief.    Past Medical History:  Diagnosis Date  . Allergy   . Asthma   . Eczema     Patient Active Problem List   Diagnosis Date Noted  . Rhinitis, allergic 05/04/2015  . Asthma, moderate persistent 05/04/2015  . Tobacco use disorder 05/04/2015  . Eczema 05/04/2015    Past Surgical History:  Procedure Laterality Date  . MOUTH SURGERY      OB History    No data available       Home Medications    Prior to Admission medications   Medication Sig Start Date End Date Taking? Authorizing Provider  albuterol (PROVENTIL HFA;VENTOLIN HFA) 108 (90 Base) MCG/ACT inhaler Inhale 2 puffs into the lungs every 6 (six) hours as needed for wheezing or shortness of breath. 11/14/16  Yes Neva Seat, Tiffany, PA-C  triamcinolone ointment  (KENALOG) 0.1 % Apply 1 application topically 4 (four) times daily.   Yes [provider]  azithromycin (ZITHROMAX) 250 MG tablet Take two tabs on the first day then 1 tab daily. Patient not taking: Reported on 02/27/2017 11/14/16   Marlon Pel, PA-C  beclomethasone (QVAR) 40 MCG/ACT inhaler Inhale 2 puffs into the lungs 2 (two) times daily. Patient not taking: Reported on 02/27/2017 03/05/15   Terressa Koyanagi, DO  fluocinonide cream (LIDEX) 0.05 % Apply topically 2 (two) times daily. Patient not taking: Reported on 02/27/2017 02/03/13   Antony Madura, PA-C  guaiFENesin-codeine 100-10 MG/5ML syrup Take 5-10 mLs by mouth at bedtime as needed for cough. Patient not taking: Reported on 02/27/2017 11/14/16   Marlon Pel, PA-C  montelukast (SINGULAIR) 10 MG tablet Take 1 tablet (10 mg total) by mouth at bedtime. Patient not taking: Reported on 02/27/2017 08/06/15   Terressa Koyanagi, DO    Family History Family History  Problem Relation Age of Onset  . Breast cancer Maternal Grandfather     Social History Social History   Tobacco Use  . Smoking status: Current Some Day Smoker    Packs/day: 0.25    Years: 3.00    Pack years: 0.75    Types: Cigarettes  . Smokeless tobacco: Never Used  . Tobacco comment: Pt stated she smoked intermittently for 3 years.   Substance Use Topics  . Alcohol use: Yes    Alcohol/week: 0.0 oz  Comment: occ, 2-3 drinks   . Drug use: No     Allergies   Peanuts [peanut oil] and Sulfa antibiotics   Review of Systems Review of Systems  HENT: Positive for congestion and rhinorrhea.   Respiratory: Positive for cough and wheezing.   Neurological: Positive for headaches.  All other systems reviewed and are negative.    Physical Exam Updated Vital Signs BP (!) 162/85   Pulse (!) 130   Temp 98.1 F (36.7 C) (Oral)   Resp (!) 24   LMP 01/25/2017   SpO2 93%   Physical Exam  Constitutional: She is oriented to person, place, and time. She appears  well-developed and well-nourished. No distress.  HENT:  Head: Normocephalic and atraumatic.  Right Ear: External ear normal.  Left Ear: External ear normal.  Nose: Mucosal edema and rhinorrhea present.  Mouth/Throat: Oropharynx is clear and moist.  Eyes: Conjunctivae and EOM are normal. Pupils are equal, round, and reactive to light. Right eye exhibits no discharge.  Neck: Normal range of motion. Neck supple.  Cardiovascular: Regular rhythm, normal heart sounds and intact distal pulses. Tachycardia present.  No murmur heard. Pulmonary/Chest: Tachypnea noted. No respiratory distress. She has no decreased breath sounds. She has wheezes. She has no rhonchi. She has no rales.  Abdominal: Soft. There is no tenderness.  Musculoskeletal: Normal range of motion. She exhibits no edema or tenderness.  Neurological: She is alert and oriented to person, place, and time.  Skin: Skin is warm and dry. No rash noted.  Psychiatric: She has a normal mood and affect.     ED Treatments / Results  Labs (all labs ordered are listed, but only abnormal results are displayed) Labs Reviewed - No data to display  EKG  EKG Interpretation None       Radiology Dg Chest 2 View  Result Date: 02/27/2017 CLINICAL DATA:  Shortness of breath.  Cough. EXAM: CHEST  2 VIEW COMPARISON:  05/20/2016. FINDINGS: Mediastinum hilar structures normal. Lungs are clear. No pleural effusion or pneumothorax. Heart size normal. No acute bony abnormality. IMPRESSION: No acute cardiopulmonary disease. Electronically Signed   By: Maisie Fushomas  Register   On: 02/27/2017 09:22    Procedures Procedures (including critical care time)  Medications Ordered in ED Medications  albuterol (PROVENTIL) (2.5 MG/3ML) 0.083% nebulizer solution 5 mg (not administered)  albuterol (PROVENTIL) (2.5 MG/3ML) 0.083% nebulizer solution 5 mg (not administered)  ipratropium (ATROVENT) nebulizer solution 0.5 mg (not administered)  predniSONE (DELTASONE)  tablet 60 mg (not administered)     Initial Impression / Assessment and Plan / ED Course  I have reviewed the triage vital signs and the nursing notes.  Pertinent labs & imaging results that were available during my care of the patient were reviewed by me and considered in my medical decision making (see chart for details).     Pt with typical asthma exacerbation  Symptoms in setting of new URI.  No productive cough or other complaints and CXR wnl.  Wheezing on exam.  will give steroids, albuterol/atrovent and recheck.  12:44 PM Wheezing resolved.  Repeat HR is 115.   Final Clinical Impressions(s) / ED Diagnoses   Final diagnoses:  Exacerbation of persistent asthma, unspecified asthma severity  Viral upper respiratory tract infection    ED Discharge Orders        Ordered    albuterol (PROVENTIL HFA;VENTOLIN HFA) 108 (90 Base) MCG/ACT inhaler  Every 6 hours PRN     02/27/17 1245    predniSONE (DELTASONE)  20 MG tablet  Daily     02/27/17 1245       Gwyneth Sprout, MD 02/27/17 1245

## 2017-04-25 ENCOUNTER — Emergency Department (HOSPITAL_COMMUNITY)
Admission: EM | Admit: 2017-04-25 | Discharge: 2017-04-26 | Disposition: A | Discharge status: Home / Self Care | Payer: Self-pay | Attending: Emergency Medicine | Admitting: Emergency Medicine

## 2017-04-25 ENCOUNTER — Encounter (HOSPITAL_COMMUNITY): Payer: Self-pay | Admitting: Emergency Medicine

## 2017-04-25 ENCOUNTER — Emergency Department (HOSPITAL_COMMUNITY): Payer: Self-pay

## 2017-04-25 ENCOUNTER — Other Ambulatory Visit: Payer: Self-pay

## 2017-04-25 DIAGNOSIS — F17210 Nicotine dependence, cigarettes, uncomplicated: Secondary | ICD-10-CM | POA: Insufficient documentation

## 2017-04-25 DIAGNOSIS — R05 Cough: Secondary | ICD-10-CM | POA: Insufficient documentation

## 2017-04-25 DIAGNOSIS — R062 Wheezing: Secondary | ICD-10-CM | POA: Insufficient documentation

## 2017-04-25 DIAGNOSIS — J4521 Mild intermittent asthma with (acute) exacerbation: Secondary | ICD-10-CM | POA: Insufficient documentation

## 2017-04-25 MED ORDER — ALBUTEROL SULFATE (2.5 MG/3ML) 0.083% IN NEBU
5.0000 mg | INHALATION_SOLUTION | Freq: Once | RESPIRATORY_TRACT | Status: AC
  Administered 2017-04-25: 5 mg via RESPIRATORY_TRACT
  Filled 2017-04-25: qty 6

## 2017-04-25 NOTE — ED Triage Notes (Signed)
Pt stated that she has been using her inhaler due to shortness of breath and wheezing over the last 24 hours. Reports moist cough and and headache x 4 days.Faint end expiratory wheezing noted, anterior, Pt has no difficulty speaking.

## 2017-04-26 MED ORDER — ALBUTEROL SULFATE HFA 108 (90 BASE) MCG/ACT IN AERS
1.0000 | INHALATION_SPRAY | Freq: Four times a day (QID) | RESPIRATORY_TRACT | 0 refills | Status: DC | PRN
Start: 2017-04-26 — End: 2017-12-12

## 2017-04-26 MED ORDER — PREDNISONE 20 MG PO TABS
60.0000 mg | ORAL_TABLET | Freq: Once | ORAL | Status: AC
  Administered 2017-04-26: 60 mg via ORAL
  Filled 2017-04-26: qty 3

## 2017-04-26 MED ORDER — ALBUTEROL SULFATE (2.5 MG/3ML) 0.083% IN NEBU
5.0000 mg | INHALATION_SOLUTION | Freq: Once | RESPIRATORY_TRACT | Status: AC
  Administered 2017-04-26: 5 mg via RESPIRATORY_TRACT
  Filled 2017-04-26: qty 6

## 2017-04-26 MED ORDER — ALBUTEROL SULFATE HFA 108 (90 BASE) MCG/ACT IN AERS
2.0000 | INHALATION_SPRAY | Freq: Once | RESPIRATORY_TRACT | Status: AC
  Administered 2017-04-26: 2 via RESPIRATORY_TRACT
  Filled 2017-04-26: qty 6.7

## 2017-04-26 MED ORDER — IPRATROPIUM BROMIDE 0.02 % IN SOLN
0.5000 mg | Freq: Once | RESPIRATORY_TRACT | Status: AC
  Administered 2017-04-26: 0.5 mg via RESPIRATORY_TRACT
  Filled 2017-04-26: qty 2.5

## 2017-04-26 MED ORDER — PREDNISONE 20 MG PO TABS: ORAL_TABLET | ORAL | 0 refills | Status: DC

## 2017-04-26 NOTE — ED Provider Notes (Signed)
[facility]-EMERGENCY DEPT Provider Note   CSN: [id number] Arrival date & time: 04/25/17  2059     History   Chief Complaint Chief Complaint  Patient presents with  . Cough  . Wheezing    HPI Connie Payne is a 27 y.o. female.  The history is provided by the patient and medical records.  Cough  Associated symptoms include wheezing.  Wheezing   Associated symptoms include cough.     27 year old female with history of eczema, allergies and asthma, presenting to the ED with cough and wheezing.  Reports she had a cold recently and has been having a lot of issues with her asthma.  States she has been coughing and wheezing nearly all day today.  Cough is intermittently productive with white sputum.  She denies any fever or chills.  No chest pain.  States she is gone through almost an entire inhaler at home today without any significant relief.  She was given neb treatment in triage with mild improvement.  Past Medical History:  Diagnosis Date  . Allergy   . Asthma   . Eczema     Patient Active Problem List   Diagnosis Date Noted  . Rhinitis, allergic 05/04/2015  . Asthma, moderate persistent 05/04/2015  . Tobacco use disorder 05/04/2015  . Eczema 05/04/2015    Past Surgical History:  Procedure Laterality Date  . MOUTH SURGERY    . WISDOM TOOTH EXTRACTION      OB History    No data available       Home Medications    Prior to Admission medications   Medication Sig Start Date End Date Taking? Authorizing Provider  albuterol (PROVENTIL HFA;VENTOLIN HFA) 108 (90 Base) MCG/ACT inhaler Inhale 1-2 puffs into the lungs every 6 (six) hours as needed for wheezing or shortness of breath. 02/27/17  Yes Plunkett, Alphonzo LemmingsWhitney, MD  azithromycin (ZITHROMAX) 250 MG tablet Take two tabs on the first day then 1 tab daily. Patient not taking: Reported on 02/27/2017 11/14/16   Marlon PelGreene, Tiffany, PA-C  beclomethasone (QVAR) 40 MCG/ACT inhaler Inhale 2 puffs into  the lungs 2 (two) times daily. Patient not taking: Reported on 02/27/2017 03/05/15   Kriste BasqueKim, Hannah R, DO  guaiFENesin-codeine 100-10 MG/5ML syrup Take 5-10 mLs by mouth at bedtime as needed for cough. Patient not taking: Reported on 02/27/2017 11/14/16   Marlon PelGreene, Tiffany, PA-C  predniSONE (DELTASONE) 20 MG tablet Take 2 tablets (40 mg total) by mouth daily. Patient not taking: Reported on 04/25/2017 02/27/17   Gwyneth SproutPlunkett, Whitney, MD    Family History Family History  Problem Relation Age of Onset  . Breast cancer Maternal Grandfather   . Hypertension Maternal Grandfather     Social History Social History   Tobacco Use  . Smoking status: Current Some Day Smoker    Packs/day: 0.25    Years: 3.00    Pack years: 0.75    Types: Cigarettes  . Smokeless tobacco: Never Used  . Tobacco comment: Pt stated she smoked intermittently for 3 years.   Substance Use Topics  . Alcohol use: Yes    Alcohol/week: 0.0 oz    Comment: occ, 2-3 drinks   . Drug use: No     Allergies   Peanuts [peanut oil] and Sulfa antibiotics   Review of Systems Review of Systems  Respiratory: Positive for cough and wheezing.   All other systems reviewed and are negative.    Physical Exam Updated Vital Signs BP 130/83 (BP Location: Left Arm)  Pulse (!) 108   Temp 98.6 F (37 C) (Oral)   Resp 20   Wt 81.6 kg (180 lb)   LMP 04/25/2017   SpO2 95%   BMI 29.05 kg/m   Physical Exam  Constitutional: She is oriented to person, place, and time. She appears well-developed and well-nourished.  HENT:  Head: Normocephalic and atraumatic.  Mouth/Throat: Oropharynx is clear and moist.  Eyes: Conjunctivae and EOM are normal. Pupils are equal, round, and reactive to light.  Neck: Normal range of motion.  Cardiovascular: Normal rate, regular rhythm and normal heart sounds.  Pulmonary/Chest: Effort normal. No stridor. No respiratory distress. She has wheezes.  Diffuse wheezes throughout, no acute distress, able to speak  in full sentences without difficulty  Abdominal: Soft. Bowel sounds are normal. There is no tenderness. There is no rebound.  Musculoskeletal: Normal range of motion.  Neurological: She is alert and oriented to person, place, and time.  Skin: Skin is warm and dry.  Psychiatric: She has a normal mood and affect.  Nursing note and vitals reviewed.    ED Treatments / Results  Labs (all labs ordered are listed, but only abnormal results are displayed) Labs Reviewed - No data to display  EKG  EKG Interpretation None       Radiology Dg Chest 2 View  Result Date: 04/25/2017 CLINICAL DATA:  27 y/o  F; shortness of breath and chest pain. EXAM: CHEST - 2 VIEW COMPARISON:  02/27/2017 chest radiograph FINDINGS: Stable heart size and mediastinal contours are within normal limits. Both lungs are clear. The visualized skeletal structures are unremarkable. IMPRESSION: No active cardiopulmonary disease. Electronically Signed   By: Mitzi Hansen M.D.   On: 04/25/2017 22:16    Procedures Procedures (including critical care time)  Medications Ordered in ED Medications  predniSONE (DELTASONE) tablet 60 mg (not administered)  albuterol (PROVENTIL) (2.5 MG/3ML) 0.083% nebulizer solution 5 mg (not administered)  ipratropium (ATROVENT) nebulizer solution 0.5 mg (not administered)  albuterol (PROVENTIL) (2.5 MG/3ML) 0.083% nebulizer solution 5 mg (5 mg Nebulization Given 04/25/17 2232)     Initial Impression / Assessment and Plan / ED Course  I have reviewed the triage vital signs and the nursing notes.  Pertinent labs & imaging results that were available during my care of the patient were reviewed by me and considered in my medical decision making (see chart for details).  27 y.o. F here with cough and wheezing.  Recent URI.  She is afebrile and nontoxic.  Lungs with expiratory wheezes.  Did have a neb out front with some mild relief.  Chest x-ray was obtained from triage that is clear.   Will give additional nebs as well as dose of prednisone.  Will reassess.  Lungs sound have cleared after additional nebs.  Patient VS remain stable.  She is stable for discharge.  Have given her inhaler here, d/c home with prescription for same as well as prednisone taper.  Discussed plan with patient, she acknowledged understanding and agreed with plan of care.  Return precautions given for new or worsening symptoms.  Final Clinical Impressions(s) / ED Diagnoses   Final diagnoses:  Mild intermittent asthma with acute exacerbation    ED Discharge Orders        Ordered    predniSONE (DELTASONE) 20 MG tablet     04/26/17 0302    albuterol (PROVENTIL HFA;VENTOLIN HFA) 108 (90 Base) MCG/ACT inhaler  Every 6 hours PRN     04/26/17 0302  Garlon Hatchet, PA-C 04/26/17 0305    Lorre Nick, MD 04/26/17 [phone]

## 2017-04-26 NOTE — ED Notes (Signed)
Pt resting in bed.  No distress noted and pt states she feels better than she did on arrival.  Family at bedside.

## 2017-04-26 NOTE — Discharge Instructions (Signed)
Take the prescribed medication as directed. °Follow-up with your primary care doctor. °Return to the ED for new or worsening symptoms. °

## 2017-08-24 ENCOUNTER — Emergency Department (HOSPITAL_COMMUNITY)
Admission: EM | Admit: 2017-08-24 | Discharge: 2017-08-25 | Disposition: A | Discharge status: Home / Self Care | Payer: Self-pay | Attending: Emergency Medicine | Admitting: Emergency Medicine

## 2017-08-24 ENCOUNTER — Emergency Department (HOSPITAL_COMMUNITY): Payer: Self-pay

## 2017-08-24 ENCOUNTER — Encounter (HOSPITAL_COMMUNITY): Payer: Self-pay

## 2017-08-24 ENCOUNTER — Other Ambulatory Visit: Payer: Self-pay

## 2017-08-24 DIAGNOSIS — R0602 Shortness of breath: Secondary | ICD-10-CM | POA: Insufficient documentation

## 2017-08-24 DIAGNOSIS — Z5321 Procedure and treatment not carried out due to patient leaving prior to being seen by health care provider: Secondary | ICD-10-CM | POA: Insufficient documentation

## 2017-08-24 MED ORDER — ALBUTEROL SULFATE (2.5 MG/3ML) 0.083% IN NEBU
5.0000 mg | INHALATION_SOLUTION | Freq: Once | RESPIRATORY_TRACT | Status: AC
  Administered 2017-08-24: 5 mg via RESPIRATORY_TRACT
  Filled 2017-08-24: qty 6

## 2017-08-24 NOTE — ED Triage Notes (Signed)
Pt c/o increasing shortness of breath starting last night. Hx of asthma. Pt reports running out of inhaler. Speaking full sentences in triage

## 2017-08-25 NOTE — ED Notes (Signed)
Follow up call made  No answer  08/25/17  1122  s Rollo Farquhar rn

## 2017-08-25 NOTE — ED Notes (Signed)
Patient called to consult room to update vital signs, patient did not come when called.

## 2017-08-25 NOTE — ED Notes (Signed)
No answer for room 

## 2017-12-12 ENCOUNTER — Emergency Department (HOSPITAL_COMMUNITY)
Admission: EM | Admit: 2017-12-12 | Discharge: 2017-12-12 | Disposition: A | Discharge status: Home / Self Care | Payer: Self-pay | Attending: Emergency Medicine | Admitting: Emergency Medicine

## 2017-12-12 ENCOUNTER — Other Ambulatory Visit: Payer: Self-pay

## 2017-12-12 DIAGNOSIS — Z91010 Allergy to peanuts: Secondary | ICD-10-CM | POA: Insufficient documentation

## 2017-12-12 DIAGNOSIS — Z79899 Other long term (current) drug therapy: Secondary | ICD-10-CM | POA: Insufficient documentation

## 2017-12-12 DIAGNOSIS — F17210 Nicotine dependence, cigarettes, uncomplicated: Secondary | ICD-10-CM | POA: Insufficient documentation

## 2017-12-12 DIAGNOSIS — J4520 Mild intermittent asthma, uncomplicated: Secondary | ICD-10-CM | POA: Insufficient documentation

## 2017-12-12 MED ORDER — PREDNISONE 10 MG (21) PO TBPK: ORAL_TABLET | Freq: Every day | ORAL | 0 refills | Status: DC

## 2017-12-12 MED ORDER — ALBUTEROL SULFATE HFA 108 (90 BASE) MCG/ACT IN AERS: 1.0000 | INHALATION_SPRAY | Freq: Four times a day (QID) | RESPIRATORY_TRACT | 0 refills | Status: DC | PRN

## 2017-12-12 MED ORDER — PREDNISONE 20 MG PO TABS
60.0000 mg | ORAL_TABLET | Freq: Once | ORAL | Status: AC
  Administered 2017-12-12: 60 mg via ORAL
  Filled 2017-12-12: qty 3

## 2017-12-12 MED ORDER — IPRATROPIUM-ALBUTEROL 0.5-2.5 (3) MG/3ML IN SOLN
3.0000 mL | Freq: Once | RESPIRATORY_TRACT | Status: AC
  Administered 2017-12-12: 3 mL via RESPIRATORY_TRACT
  Filled 2017-12-12: qty 3

## 2017-12-12 MED ORDER — ALBUTEROL SULFATE HFA 108 (90 BASE) MCG/ACT IN AERS
1.0000 | INHALATION_SPRAY | Freq: Once | RESPIRATORY_TRACT | Status: AC
  Administered 2017-12-12: 1 via RESPIRATORY_TRACT
  Filled 2017-12-12: qty 6.7

## 2017-12-12 MED ORDER — AEROCHAMBER PLUS FLO-VU MEDIUM MISC
1.0000 | Freq: Once | Status: AC
  Administered 2017-12-12: 1
  Filled 2017-12-12: qty 1

## 2017-12-12 NOTE — Discharge Instructions (Addendum)
Take steroids beginning tomorrow. Return to ED for worsening symptoms, chest pain, vomiting or coughing up blood, trouble breathing or trouble swallowing.

## 2017-12-12 NOTE — ED Provider Notes (Signed)
[facility]-EMERGENCY DEPT Provider Note   CSN: [id number] Arrival date & time: 12/12/17  4098     History   Chief Complaint Chief Complaint  Patient presents with  . Asthma    HPI Connie Payne is a 27 y.o. female with a past medical history of asthma, who presents to ED for evaluation of asthma flareup for the past 3 hours.  States that she woke up feeling short of breath and wheezing.  She ran out of her albuterol inhaler 1 week ago so she was unable to use it.  She reports similar, year-round flareups for her asthma.  Reports intermittent cough.  Denies any chest pain, hemoptysis, fever, sick contacts.  HPI  Past Medical History:  Diagnosis Date  . Allergy   . Asthma   . Eczema     Patient Active Problem List   Diagnosis Date Noted  . Rhinitis, allergic 05/04/2015  . Asthma, moderate persistent 05/04/2015  . Tobacco use disorder 05/04/2015  . Eczema 05/04/2015    Past Surgical History:  Procedure Laterality Date  . MOUTH SURGERY    . WISDOM TOOTH EXTRACTION       OB History   None      Home Medications    Prior to Admission medications   Medication Sig Start Date End Date Taking? Authorizing Provider  triamcinolone cream (KENALOG) 0.1 % Apply 1 application topically 2 (two) times daily.   Yes [provider]  albuterol (PROVENTIL HFA;VENTOLIN HFA) 108 (90 Base) MCG/ACT inhaler Inhale 1-2 puffs into the lungs every 6 (six) hours as needed for wheezing or shortness of breath. 12/12/17   Haislee Corso, PA-C  predniSONE (STERAPRED UNI-PAK 21 TAB) 10 MG (21) TBPK tablet Take by mouth daily. Take 6 tabs by mouth daily  for 1 days, then 5 tabs for 2 days, then 4 tabs for 2 days, then 3 tabs for 2 days, 2 tabs for 2 days, then 1 tab by mouth daily for 2 days 12/12/17   Dietrich Pates, PA-C    Family History Family History  Problem Relation Age of Onset  . Breast cancer Maternal Grandfather   . Hypertension Maternal  Grandfather     Social History Social History   Tobacco Use  . Smoking status: Current Some Day Smoker    Packs/day: 0.25    Years: 3.00    Pack years: 0.75    Types: Cigarettes  . Smokeless tobacco: Never Used  . Tobacco comment: Pt stated she smoked intermittently for 3 years.   Substance Use Topics  . Alcohol use: Yes    Alcohol/week: 0.0 standard drinks    Comment: occ, 2-3 drinks   . Drug use: No     Allergies   Peanuts [peanut oil] and Sulfa antibiotics   Review of Systems Review of Systems  Constitutional: Negative for appetite change, chills and fever.  HENT: Negative for ear pain, rhinorrhea, sneezing and sore throat.   Eyes: Negative for photophobia and visual disturbance.  Respiratory: Positive for shortness of breath and wheezing. Negative for cough and chest tightness.   Cardiovascular: Negative for chest pain and palpitations.  Gastrointestinal: Negative for abdominal pain, blood in stool, constipation, diarrhea, nausea and vomiting.  Genitourinary: Negative for dysuria, hematuria and urgency.  Musculoskeletal: Negative for myalgias.  Skin: Negative for rash.  Neurological: Negative for dizziness, weakness and light-headedness.     Physical Exam Updated Vital Signs BP 109/87   Pulse 83   Temp 98.6 F (37 C) (  Oral)   Resp 14   LMP 12/10/2017 (Exact Date)   SpO2 100%   Physical Exam  Constitutional: She appears well-developed and well-nourished. No distress.  Nontoxic-appearing.  Speaking complete sentences without difficulty.  HENT:  Head: Normocephalic and atraumatic.  Nose: Nose normal.  Eyes: Conjunctivae and EOM are normal. Left eye exhibits no discharge. No scleral icterus.  Neck: Normal range of motion. Neck supple.  Cardiovascular: Normal rate, regular rhythm, normal heart sounds and intact distal pulses. Exam reveals no gallop and no friction rub.  No murmur heard. Pulmonary/Chest: Effort normal. No respiratory distress. She has  wheezes (Expiratory, bilateral lung fields).  Abdominal: Soft. Bowel sounds are normal. She exhibits no distension. There is no tenderness. There is no guarding.  Musculoskeletal: Normal range of motion. She exhibits no edema.  Neurological: She is alert. She exhibits normal muscle tone. Coordination normal.  Skin: Skin is warm and dry. No rash noted.  Psychiatric: She has a normal mood and affect.  Nursing note and vitals reviewed.    ED Treatments / Results  Labs (all labs ordered are listed, but only abnormal results are displayed) Labs Reviewed - No data to display  EKG EKG Interpretation  Date/Time:  Saturday December 12 2017 07:39:01 EDT Ventricular Rate:  86 PR Interval:    QRS Duration: 101 QT Interval:  392 QTC Calculation: 469 R Axis:   -35 Text Interpretation:  Unknown rhythm, irregular rate Left axis deviation RSR' in V1 or V2, probably normal variant Since last tracing rate slower Confirmed by Doug Sou [phone]) on 12/12/2017 7:41:57 AM   Radiology No results found.  Procedures Procedures (including critical care time)  Medications Ordered in ED Medications  albuterol (PROVENTIL HFA;VENTOLIN HFA) 108 (90 Base) MCG/ACT inhaler 1 puff (has no administration in time range)  AEROCHAMBER PLUS FLO-VU MEDIUM MISC 1 each (has no administration in time range)  ipratropium-albuterol (DUONEB) 0.5-2.5 (3) MG/3ML nebulizer solution 3 mL (3 mLs Nebulization Given 12/12/17 0855)  predniSONE (DELTASONE) tablet 60 mg (60 mg Oral Given 12/12/17 0854)     Initial Impression / Assessment and Plan / ED Course  I have reviewed the triage vital signs and the nursing notes.  Pertinent labs & imaging results that were available during my care of the patient were reviewed by me and considered in my medical decision making (see chart for details).  Clinical Course as of Dec 13 1006  Sat Dec 12, 2017  1006 Patient reports improvement in her symptoms with DuoNeb and prednisone  given.   [HK]    Clinical Course User Index [HK] Dietrich Pates, PA-C    27 year old female with past medical history of asthma presents for asthma flareup for the past 3 hours.  She woke up feeling short of breath and wheezing.  Reports that it feels similar to her prior year-round asthma flareups.  She ran out of her albuterol inhaler 1 week ago.  Denies any chest pain, hemoptysis, fever or sick contacts.  On initial exam patient had mild expiratory wheezing noted in bilateral lung fields.  She was speaking complete sentences, not tachypneic or hypoxic.  She was given a breathing treatment and prednisone with significant and near resolution of her symptoms.  She declines additional breathing treatment.  Suspect that symptoms were due to asthma flareup.  Doubt infectious or cardiac cause of symptoms.  Will treat with steroids, refill of inhaler.  Advised her to return to ED for any severe worsening symptoms.  Patient is comfortable with plan and  is requesting discharge home.  Portions of this note were generated with Scientist, clinical (histocompatibility and immunogenetics). Dictation errors may occur despite best attempts at proofreading.   Final Clinical Impressions(s) / ED Diagnoses   Final diagnoses:  Mild intermittent asthma, unspecified whether complicated    ED Discharge Orders         Ordered    predniSONE (STERAPRED UNI-PAK 21 TAB) 10 MG (21) TBPK tablet  Daily     12/12/17 1008    albuterol (PROVENTIL HFA;VENTOLIN HFA) 108 (90 Base) MCG/ACT inhaler  Every 6 hours PRN     12/12/17 1008           Dietrich Pates, PA-C 12/12/17 1010    Doug Sou, MD 12/12/17 1623

## 2017-12-12 NOTE — ED Triage Notes (Signed)
She states she is having an asthma flare. She denies fever/productive cough, nor any other sign of current illness.

## 2018-03-06 ENCOUNTER — Encounter (HOSPITAL_COMMUNITY): Payer: Self-pay

## 2018-03-06 ENCOUNTER — Emergency Department (HOSPITAL_COMMUNITY)
Admission: EM | Admit: 2018-03-06 | Discharge: 2018-03-06 | Disposition: A | Discharge status: Home / Self Care | Payer: Self-pay | Attending: Emergency Medicine | Admitting: Emergency Medicine

## 2018-03-06 ENCOUNTER — Other Ambulatory Visit: Payer: Self-pay

## 2018-03-06 DIAGNOSIS — Z79899 Other long term (current) drug therapy: Secondary | ICD-10-CM | POA: Insufficient documentation

## 2018-03-06 DIAGNOSIS — F17210 Nicotine dependence, cigarettes, uncomplicated: Secondary | ICD-10-CM | POA: Insufficient documentation

## 2018-03-06 DIAGNOSIS — Z91010 Allergy to peanuts: Secondary | ICD-10-CM | POA: Insufficient documentation

## 2018-03-06 DIAGNOSIS — Z720 Tobacco use: Secondary | ICD-10-CM

## 2018-03-06 DIAGNOSIS — J45901 Unspecified asthma with (acute) exacerbation: Secondary | ICD-10-CM | POA: Insufficient documentation

## 2018-03-06 MED ORDER — IPRATROPIUM BROMIDE 0.02 % IN SOLN
0.5000 mg | Freq: Once | RESPIRATORY_TRACT | Status: AC
  Administered 2018-03-06: 0.5 mg via RESPIRATORY_TRACT
  Filled 2018-03-06: qty 2.5

## 2018-03-06 MED ORDER — ALBUTEROL SULFATE HFA 108 (90 BASE) MCG/ACT IN AERS
2.0000 | INHALATION_SPRAY | Freq: Once | RESPIRATORY_TRACT | Status: AC
  Administered 2018-03-06: 2 via RESPIRATORY_TRACT
  Filled 2018-03-06: qty 6.7

## 2018-03-06 MED ORDER — PREDNISONE 20 MG PO TABS
60.0000 mg | ORAL_TABLET | Freq: Once | ORAL | Status: AC
  Administered 2018-03-06: 60 mg via ORAL
  Filled 2018-03-06: qty 3

## 2018-03-06 MED ORDER — PREDNISONE 20 MG PO TABS: ORAL_TABLET | ORAL | 0 refills | Status: DC

## 2018-03-06 MED ORDER — ALBUTEROL SULFATE HFA 108 (90 BASE) MCG/ACT IN AERS: 1.0000 | INHALATION_SPRAY | RESPIRATORY_TRACT | 0 refills | Status: DC | PRN

## 2018-03-06 MED ORDER — ALBUTEROL SULFATE (2.5 MG/3ML) 0.083% IN NEBU
5.0000 mg | INHALATION_SOLUTION | Freq: Once | RESPIRATORY_TRACT | Status: AC
  Administered 2018-03-06: 5 mg via RESPIRATORY_TRACT
  Filled 2018-03-06: qty 6

## 2018-03-06 NOTE — ED Provider Notes (Signed)
[facility]-EMERGENCY DEPT Provider Note   CSN: [id number] Arrival date & time: 03/06/18  1616     History   Chief Complaint Chief Complaint  Patient presents with  . Asthma    HPI BRYNLEIGH FURBUSH is a 28 y.o. female with a PMHx of asthma and eczema, who presents to the ED with complaints of asthma exacerbation that began this morning around 7:30 AM.  Patient states that she was able to "calm herself down" and her symptoms improved, but about an hour ago they returned so she decided to come here.  She does not have an inhaler at home or anything to take at home is another reason she decided to come here for evaluation.  Her symptoms include wheezing, shortness of breath, chest tightness, and dry cough.  She was unable to try anything at home since she does not have anything.  Being cold or hot seems to worsen her symptoms.  Her symptoms all feel like prior asthma exacerbations.  Unfortunately she is a cigarette smoker.  She denies any rhinorrhea, sore throat, ear pain or drainage, fevers, chills, chest pain aside from tightness, leg swelling, recent travel/surgery/immobilization, estrogen use, personal or family history of DVT/PE, abdominal pain, nausea, vomiting, diarrhea, constipation, dysuria, hematuria, myalgias, arthralgias, numbness, tingling, focal weakness, or any other complaints at this time.  The history is provided by the patient and medical records. No language interpreter was used.  Asthma  Associated symptoms include shortness of breath. Pertinent negatives include no chest pain and no abdominal pain.    Past Medical History:  Diagnosis Date  . Allergy   . Asthma   . Eczema     Patient Active Problem List   Diagnosis Date Noted  . Rhinitis, allergic 05/04/2015  . Asthma, moderate persistent 05/04/2015  . Tobacco use disorder 05/04/2015  . Eczema 05/04/2015    Past Surgical History:  Procedure Laterality Date  . MOUTH SURGERY    .  WISDOM TOOTH EXTRACTION       OB History   No obstetric history on file.      Home Medications    Prior to Admission medications   Medication Sig Start Date End Date Taking? Authorizing Provider  albuterol (PROVENTIL HFA;VENTOLIN HFA) 108 (90 Base) MCG/ACT inhaler Inhale 1-2 puffs into the lungs every 6 (six) hours as needed for wheezing or shortness of breath. 12/12/17   Khatri, Hina, PA-C  predniSONE (STERAPRED UNI-PAK 21 TAB) 10 MG (21) TBPK tablet Take by mouth daily. Take 6 tabs by mouth daily  for 1 days, then 5 tabs for 2 days, then 4 tabs for 2 days, then 3 tabs for 2 days, 2 tabs for 2 days, then 1 tab by mouth daily for 2 days 12/12/17   Idelle Leech, Hina, PA-C  triamcinolone cream (KENALOG) 0.1 % Apply 1 application topically 2 (two) times daily.    [provider]    Family History Family History  Problem Relation Age of Onset  . Breast cancer Maternal Grandfather   . Hypertension Maternal Grandfather     Social History Social History   Tobacco Use  . Smoking status: Current Some Day Smoker    Packs/day: 0.25    Years: 3.00    Pack years: 0.75    Types: Cigarettes  . Smokeless tobacco: Never Used  . Tobacco comment: Pt stated she smoked intermittently for 3 years.   Substance Use Topics  . Alcohol use: Yes    Alcohol/week: 0.0 standard drinks  Comment: occ, 2-3 drinks   . Drug use: No     Allergies   Peanuts [peanut oil] and Sulfa antibiotics   Review of Systems Review of Systems  Constitutional: Negative for chills and fever.  HENT: Negative for ear discharge, ear pain, rhinorrhea and sore throat.   Respiratory: Positive for cough, chest tightness, shortness of breath and wheezing.   Cardiovascular: Negative for chest pain and leg swelling.  Gastrointestinal: Negative for abdominal pain, constipation, diarrhea, nausea and vomiting.  Genitourinary: Negative for dysuria and hematuria.  Musculoskeletal: Negative for arthralgias and myalgias.    Skin: Negative for color change.  Allergic/Immunologic: Negative for immunocompromised state.  Neurological: Negative for weakness and numbness.  Psychiatric/Behavioral: Negative for confusion.   All other systems reviewed and are negative for acute change except as noted in the HPI.    Physical Exam Updated Vital Signs BP 126/81 (BP Location: Left Arm)   Pulse 81   Temp 98.1 F (36.7 C) (Oral)   Resp 18   Ht 5\' 7"  (1.702 m)   Wt 88.5 kg   LMP 02/22/2018 (Approximate)   SpO2 99%   BMI 30.54 kg/m   Physical Exam Vitals signs and nursing note reviewed.  Constitutional:      General: She is not in acute distress.    Appearance: Normal appearance. She is well-developed. She is not toxic-appearing.     Comments: Afebrile, nontoxic, NAD  HENT:     Head: Normocephalic and atraumatic.  Eyes:     General:        Right eye: No discharge.        Left eye: No discharge.     Conjunctiva/sclera: Conjunctivae normal.  Neck:     Musculoskeletal: Normal range of motion and neck supple.  Cardiovascular:     Rate and Rhythm: Normal rate and regular rhythm.     Pulses: Normal pulses.     Heart sounds: Normal heart sounds, S1 normal and S2 normal. No murmur. No friction rub. No gallop.   Pulmonary:     Effort: Pulmonary effort is normal. No respiratory distress.     Breath sounds: Wheezing present. No decreased breath sounds, rhonchi or rales.     Comments: Diffuse expiratory wheezing throughout all lung fields, no rhonchi/rales, no hypoxia or increased WOB, speaking in full sentences, SpO2 99% on RA  Abdominal:     General: Bowel sounds are normal. There is no distension.     Palpations: Abdomen is soft. Abdomen is not rigid.     Tenderness: There is no abdominal tenderness. There is no right CVA tenderness, left CVA tenderness, guarding or rebound. Negative signs include Murphy's sign and McBurney's sign.  Musculoskeletal: Normal range of motion.     Comments: MAE x4 Strength and  sensation grossly intact in all extremities Distal pulses intact Gait steady No pedal edema, neg homan's bilaterally   Skin:    General: Skin is warm and dry.     Findings: No rash.  Neurological:     Mental Status: She is alert and oriented to person, place, and time.     Sensory: Sensation is intact. No sensory deficit.     Motor: Motor function is intact.  Psychiatric:        Mood and Affect: Mood and affect normal.        Behavior: Behavior normal.      ED Treatments / Results  Labs (all labs ordered are listed, but only abnormal results are displayed) Labs Reviewed -  No data to display  EKG None  Radiology No results found.  Procedures Procedures (including critical care time)  Medications Ordered in ED Medications  predniSONE (DELTASONE) tablet 60 mg (60 mg Oral Given 03/06/18 1650)  albuterol (PROVENTIL) (2.5 MG/3ML) 0.083% nebulizer solution 5 mg (5 mg Nebulization Given 03/06/18 1650)  ipratropium (ATROVENT) nebulizer solution 0.5 mg (0.5 mg Nebulization Given 03/06/18 1650)  albuterol (PROVENTIL HFA;VENTOLIN HFA) 108 (90 Base) MCG/ACT inhaler 2 puff (2 puffs Inhalation Given 03/06/18 1730)     Initial Impression / Assessment and Plan / ED Course  I have reviewed the triage vital signs and the nursing notes.  Pertinent labs & imaging results that were available during my care of the patient were reviewed by me and considered in my medical decision making (see chart for details).     28 y.o. female here with asthma exacerbation that began this morning.  On exam, diffuse expiratory wheezing throughout all lung fields, no rhonchi or rales.  Overall well-appearing, not hypoxic, no increased work of breathing.  Exam consistent with asthma exacerbation.  PERC neg, doubt PE. Doubt need for labs or imaging, will give duo nebs, prednisone, and reassess.  5:23 PM Pt feeling better and lung sounds improved after duonebs. Will send home with inhaler and refill rx,  prednisone x4 days starting tomorrow, advised use of daily antihistamine, and other OTC remedies for symptomatic relief. F/up with PCP in 5-7 days for recheck of symptoms. Smoking cessation strongly advised.  I explained the diagnosis and have given explicit precautions to return to the ER including for any other new or worsening symptoms. The patient understands and accepts the medical plan as it's been dictated and I have answered their questions. Discharge instructions concerning home care and prescriptions have been given. The patient is STABLE and is discharged to home in good condition.    Final Clinical Impressions(s) / ED Diagnoses   Final diagnoses:  Exacerbation of asthma, unspecified asthma severity, unspecified whether persistent  Tobacco user    ED Discharge Orders         Ordered    albuterol (PROVENTIL HFA;VENTOLIN HFA) 108 (90 Base) MCG/ACT inhaler  Every 4 hours PRN     03/06/18 1654    predniSONE (DELTASONE) 20 MG tablet     03/06/18 [address]1654           Britne Borelli, [place]Mercedes, [place] JerseyPA-C 03/06/18 1731    Arby BarrettePfeiffer, Marcy, MD 03/07/18 1839

## 2018-03-06 NOTE — ED Triage Notes (Signed)
Patient is AOx4 and ambulatory. Patient began to asthma flair up at home and was able to get asthma under control however it began to get worse later and now patient is hear with audible expiratory wheezing.

## 2018-03-06 NOTE — Discharge Instructions (Addendum)
Continue to stay well-hydrated. Use Mucinex for cough suppression/expectoration of mucus. Use over the counter antihistamines such as zyrtec, claritin, or allegra to decrease symptoms and frequency of asthma attacks. Use inhaler as directed, as needed for cough/chest congestion/wheezing/shortness of breath/etc. Take prednisone as directed for your asthma exacerbation, starting tomorrow since you received today's dose in the ER today. STOP SMOKING! Follow-up with your primary care doctor in 5-7 days for recheck of ongoing symptoms. Return to emergency department for emergent changing or worsening of symptoms.

## 2018-06-16 ENCOUNTER — Emergency Department (HOSPITAL_COMMUNITY)
Admission: EM | Admit: 2018-06-16 | Discharge: 2018-06-16 | Disposition: A | Discharge status: Home / Self Care | Payer: Self-pay | Attending: Emergency Medicine | Admitting: Emergency Medicine

## 2018-06-16 ENCOUNTER — Other Ambulatory Visit: Payer: Self-pay

## 2018-06-16 DIAGNOSIS — F17210 Nicotine dependence, cigarettes, uncomplicated: Secondary | ICD-10-CM | POA: Insufficient documentation

## 2018-06-16 DIAGNOSIS — Z79899 Other long term (current) drug therapy: Secondary | ICD-10-CM | POA: Insufficient documentation

## 2018-06-16 DIAGNOSIS — Z760 Encounter for issue of repeat prescription: Secondary | ICD-10-CM | POA: Insufficient documentation

## 2018-06-16 DIAGNOSIS — J45909 Unspecified asthma, uncomplicated: Secondary | ICD-10-CM | POA: Insufficient documentation

## 2018-06-16 DIAGNOSIS — Z91010 Allergy to peanuts: Secondary | ICD-10-CM | POA: Insufficient documentation

## 2018-06-16 MED ORDER — ALBUTEROL SULFATE HFA 108 (90 BASE) MCG/ACT IN AERS
2.0000 | INHALATION_SPRAY | Freq: Once | RESPIRATORY_TRACT | Status: AC
  Administered 2018-06-16: 17:00:00 2 via RESPIRATORY_TRACT
  Filled 2018-06-16: qty 6.7

## 2018-06-16 MED ORDER — ALBUTEROL SULFATE HFA 108 (90 BASE) MCG/ACT IN AERS: 2.0000 | INHALATION_SPRAY | RESPIRATORY_TRACT | 1 refills | Status: DC | PRN

## 2018-06-16 NOTE — ED Provider Notes (Signed)
Connie Payne   CSN: [id number] Arrival date & time: 06/16/18  1608    History   Chief Complaint Chief Complaint  Patient presents with  . Medication Refill    albuterol inhaler    HPI Connie Payne is a 28 y.o. female with a past medical history of asthma, eczema, seasonal allergies, who presents today requesting a medication refill.  She reports that she started a new job and has needed her albuterol inhaler more frequently.  She says that her current one lasted her approximately 3 weeks however has run out.  She denies any current symptoms.  She is requesting an inhaler while in the department, based on the co-pay cost, and a prescription for inhalers at home.  No fevers, chest tightness, cough, or shortness of breath at this time.     HPI  Past Medical History:  Diagnosis Date  . Allergy   . Asthma   . Eczema     Patient Active Problem List   Diagnosis Date Noted  . Rhinitis, allergic 05/04/2015  . Asthma, moderate persistent 05/04/2015  . Tobacco use disorder 05/04/2015  . Eczema 05/04/2015    Past Surgical History:  Procedure Laterality Date  . MOUTH SURGERY    . WISDOM TOOTH EXTRACTION       OB History   No obstetric history on file.      Home Medications    Prior to Admission medications   Medication Sig Start Date End Date Taking? Authorizing Provider  albuterol (PROVENTIL HFA;VENTOLIN HFA) 108 (90 Base) MCG/ACT inhaler Inhale 1-2 puffs into the lungs every 6 (six) hours as needed for wheezing or shortness of breath. 12/12/17   Khatri, Hina, PA-C  albuterol (VENTOLIN HFA) 108 (90 Base) MCG/ACT inhaler Inhale 2 puffs into the lungs every 4 (four) hours as needed for wheezing or shortness of breath (or cough). 06/16/18   Cristina GongHammond, Demetris Capell W, PA-C  predniSONE (DELTASONE) 20 MG tablet 3 tabs po daily x 4 days STARTING 03/07/2018 03/06/18   Street, Holiday IslandMercedes, PA-C  predniSONE (STERAPRED UNI-PAK 21  TAB) 10 MG (21) TBPK tablet Take by mouth daily. Take 6 tabs by mouth daily  for 1 days, then 5 tabs for 2 days, then 4 tabs for 2 days, then 3 tabs for 2 days, 2 tabs for 2 days, then 1 tab by mouth daily for 2 days Patient not taking: Reported on 03/06/2018 12/12/17   Dietrich PatesKhatri, Hina, PA-C  triamcinolone cream (KENALOG) 0.1 % Apply 1 application topically 2 (two) times daily.    [provider]    Family History Family History  Problem Relation Age of Onset  . Breast cancer Maternal Grandfather   . Hypertension Maternal Grandfather     Social History Social History   Tobacco Use  . Smoking status: Current Some Day Smoker    Packs/day: 0.25    Years: 3.00    Pack years: 0.75    Types: Cigarettes  . Smokeless tobacco: Never Used  . Tobacco comment: Pt stated she smoked intermittently for 3 years.   Substance Use Topics  . Alcohol use: Yes    Alcohol/week: 0.0 standard drinks    Comment: occ, 2-3 drinks   . Drug use: No     Allergies   Peanuts [peanut oil] and Sulfa antibiotics   Review of Systems Review of Systems  Constitutional: Negative for chills and fever.  HENT: Negative for congestion and facial swelling.   Respiratory: Negative for cough,  chest tightness and shortness of breath.   Cardiovascular: Negative for chest pain and palpitations.  Gastrointestinal: Negative for abdominal pain.  Neurological: Negative for headaches.  All other systems reviewed and are negative.    Physical Exam Updated Vital Signs BP 130/81 (BP Location: Right Arm)   Pulse 94   Temp 98.8 F (37.1 C) (Oral)   Ht 5\' 7"  (1.702 m)   Wt 95.7 kg   SpO2 98%   BMI 33.05 kg/m   Physical Exam Vitals signs and nursing Payne reviewed.  Constitutional:      General: She is not in acute distress.    Appearance: She is well-developed. She is not diaphoretic.  HENT:     Head: Normocephalic and atraumatic.  Eyes:     General: No scleral icterus.       Right eye: No discharge.         Left eye: No discharge.     Conjunctiva/sclera: Conjunctivae normal.  Neck:     Musculoskeletal: Normal range of motion.  Cardiovascular:     Rate and Rhythm: Normal rate and regular rhythm.     Pulses: Normal pulses.     Heart sounds: No murmur.  Pulmonary:     Effort: Pulmonary effort is normal. No respiratory distress.     Breath sounds: Normal breath sounds. No stridor. No wheezing or rhonchi.  Abdominal:     General: There is no distension.  Musculoskeletal:        General: No deformity.  Skin:    General: Skin is warm and dry.  Neurological:     General: No focal deficit present.     Mental Status: She is alert.     Motor: No abnormal muscle tone.  Psychiatric:        Mood and Affect: Mood normal.        Behavior: Behavior normal.      ED Treatments / Results  Labs (all labs ordered are listed, but only abnormal results are displayed) Labs Reviewed - No data to display  EKG None  Radiology No results found.  Procedures Procedures (including critical care time)  Medications Ordered in ED Medications  albuterol (VENTOLIN HFA) 108 (90 Base) MCG/ACT inhaler 2 puff (has no administration in time range)     Initial Impression / Assessment and Plan / ED Course  I have reviewed the triage vital signs and the nursing notes.  Pertinent labs & imaging results that were available during my care of the patient were reviewed by me and considered in my medical decision making (see chart for details).       Connie Payne presents today requesting an inhaler as hers is out.  She denies any current chest tightness/pain, or shortness of breath.  She does not have any symptoms, states that she is simply here to get a refill.  Lungs are clear to auscultation bilaterally, no obvious distress, 98% on room air.  Prescriptions given.    Due to current pandemic will give extra re-fills on her inhaler to help patient avoid needing to use the emergency department.    Return precautions were discussed with patient who states their understanding.  At the time of discharge patient denied any unaddressed complaints or concerns.  Patient is agreeable for discharge home.   Final Clinical Impressions(s) / ED Diagnoses   Final diagnoses:  Encounter for medication refill    ED Discharge Orders         Ordered    albuterol (VENTOLIN  HFA) 108 (90 Base) MCG/ACT inhaler  Every 4 hours PRN     06/16/18 1655           Norman Clay 06/16/18 1703    Gerhard Munch, MD 06/16/18 1754

## 2018-06-16 NOTE — ED Triage Notes (Signed)
Inhaler ran out yesterday and reports her dr just left the practice, but she uses it daily.

## 2018-08-30 ENCOUNTER — Emergency Department (HOSPITAL_COMMUNITY)
Admission: EM | Admit: 2018-08-30 | Discharge: 2018-08-30 | Disposition: A | Discharge status: Home / Self Care | Payer: 59 | Attending: Emergency Medicine | Admitting: Emergency Medicine

## 2018-08-30 ENCOUNTER — Encounter (HOSPITAL_COMMUNITY): Payer: Self-pay

## 2018-08-30 ENCOUNTER — Other Ambulatory Visit: Payer: Self-pay

## 2018-08-30 DIAGNOSIS — J45909 Unspecified asthma, uncomplicated: Secondary | ICD-10-CM | POA: Insufficient documentation

## 2018-08-30 DIAGNOSIS — Z760 Encounter for issue of repeat prescription: Secondary | ICD-10-CM | POA: Diagnosis present

## 2018-08-30 DIAGNOSIS — Z91010 Allergy to peanuts: Secondary | ICD-10-CM | POA: Diagnosis not present

## 2018-08-30 DIAGNOSIS — F17210 Nicotine dependence, cigarettes, uncomplicated: Secondary | ICD-10-CM | POA: Insufficient documentation

## 2018-08-30 MED ORDER — ALBUTEROL SULFATE HFA 108 (90 BASE) MCG/ACT IN AERS: 1.0000 | INHALATION_SPRAY | Freq: Four times a day (QID) | RESPIRATORY_TRACT | 1 refills | Status: DC | PRN

## 2018-08-30 MED ORDER — ALBUTEROL SULFATE HFA 108 (90 BASE) MCG/ACT IN AERS
2.0000 | INHALATION_SPRAY | Freq: Once | RESPIRATORY_TRACT | Status: AC
  Administered 2018-08-30: 2 via RESPIRATORY_TRACT
  Filled 2018-08-30: qty 6.7

## 2018-08-30 NOTE — Discharge Instructions (Addendum)
Follow-up with your primary care doctor for ongoing refills. Return here for new concerns.

## 2018-08-30 NOTE — ED Triage Notes (Signed)
Patient reports she just recently got insurance and she needs her rx for albuterol inhaler.

## 2018-08-30 NOTE — ED Provider Notes (Signed)
[facility] EMERGENCY DEPARTMENT Provider Note   CSN: [id number] Arrival date & time: 08/30/18  2224     History   Chief Complaint Chief Complaint  Patient presents with  . Medication Refill    HPI Connie Payne is a 28 y.o. female.     The history is provided by the patient and medical records.  Medication Refill    27 y.o. F with hx of asthma, allergies, eczema, presenting to the ED for medication refill.  States she has run out of her albuterol inhaler.  Denies any SOB or wheezing currently, just does not want to be without her medications.  Past Medical History:  Diagnosis Date  . Allergy   . Asthma   . Eczema     Patient Active Problem List   Diagnosis Date Noted  . Rhinitis, allergic 05/04/2015  . Asthma, moderate persistent 05/04/2015  . Tobacco use disorder 05/04/2015  . Eczema 05/04/2015    Past Surgical History:  Procedure Laterality Date  . MOUTH SURGERY    . WISDOM TOOTH EXTRACTION       OB History   No obstetric history on file.      Home Medications    Prior to Admission medications   Medication Sig Start Date End Date Taking? Authorizing Provider  albuterol (VENTOLIN HFA) 108 (90 Base) MCG/ACT inhaler Inhale 1-2 puffs into the lungs every 6 (six) hours as needed for wheezing. 08/30/18   [name], PA-C  predniSONE (DELTASONE) 20 MG tablet 3 tabs po daily x 4 days STARTING 03/07/2018 03/06/18   Street, [place], PA-C  predniSONE (STERAPRED UNI-PAK 21 TAB) 10 MG (21) TBPK tablet Take by mouth daily. Take 6 tabs by mouth daily  for 1 days, then 5 tabs for 2 days, then 4 tabs for 2 days, then 3 tabs for 2 days, 2 tabs for 2 days, then 1 tab by mouth daily for 2 days Patient not taking: Reported on 03/06/2018 12/12/17   [name], PA-C  triamcinolone cream (KENALOG) 0.1 % Apply 1 application topically 2 (two) times daily.    [provider]    Family History Family History  Problem Relation Age of  Onset  . Breast cancer Maternal Grandfather   . Hypertension Maternal Grandfather     Social History Social History   Tobacco Use  . Smoking status: Current Some Day Smoker    Packs/day: 0.25    Years: 3.00    Pack years: 0.75    Types: Cigarettes  . Smokeless tobacco: Never Used  . Tobacco comment: Pt stated she smoked intermittently for 3 years.   Substance Use Topics  . Alcohol use: Yes    Alcohol/week: 0.0 standard drinks    Comment: occ, 2-3 drinks   . Drug use: No     Allergies   Peanuts [peanut oil] and Sulfa antibiotics   Review of Systems Review of Systems  Constitutional:       Med refill  All other systems reviewed and are negative.    Physical Exam Updated Vital Signs BP 121/73 (BP Location: Right Arm)   Pulse 86   Temp 98.2 F (36.8 C) (Oral)   Resp 18   LMP 08/27/2018 (Within Days)   SpO2 100%   Physical Exam Vitals signs and nursing note reviewed.  Constitutional:      Appearance: She is well-developed.  HENT:     Head: Normocephalic and atraumatic.  Eyes:     Conjunctiva/sclera: Conjunctivae normal.  Pupils: Pupils are equal, round, and reactive to light.  Neck:     Musculoskeletal: Normal range of motion.  Cardiovascular:     Rate and Rhythm: Normal rate and regular rhythm.     Heart sounds: Normal heart sounds.  Pulmonary:     Effort: Pulmonary effort is normal. No respiratory distress.     Breath sounds: Normal breath sounds. No stridor. No wheezing or rhonchi.  Abdominal:     General: Bowel sounds are normal.     Palpations: Abdomen is soft.  Musculoskeletal: Normal range of motion.  Skin:    General: Skin is warm and dry.  Neurological:     Mental Status: She is alert and oriented to person, place, and time.      ED Treatments / Results  Labs (all labs ordered are listed, but only abnormal results are displayed) Labs Reviewed - No data to display  EKG None  Radiology No results found.  Procedures Procedures  (including critical care time)  Medications Ordered in ED Medications  albuterol (VENTOLIN HFA) 108 (90 Base) MCG/ACT inhaler 2 puff (has no administration in time range)     Initial Impression / Assessment and Plan / ED Course  I have reviewed the triage vital signs and the nursing notes.  Pertinent labs & imaging results that were available during my care of the patient were reviewed by me and considered in my medical decision making (see chart for details).  28 y.o. F here for refill of albuterol inhaler.  Denies current symptoms.  VSS, lungs clear on exam.  Rx for albuterol sent to pharmacy and given inhaler here.  Can follow-up with PCP for future refills.  Return here for new concerns.  Final Clinical Impressions(s) / ED Diagnoses   Final diagnoses:  Medication refill    ED Discharge Orders         Ordered    albuterol (VENTOLIN HFA) 108 (90 Base) MCG/ACT inhaler  Every 6 hours PRN     08/30/18 2315           Garlon HatchetSanders, Keyry Iracheta M, PA-C 08/30/18 2319    Mesner, Barbara CowerJason, MD 08/31/18 [phone]

## 2018-10-07 ENCOUNTER — Ambulatory Visit (INDEPENDENT_AMBULATORY_CARE_PROVIDER_SITE_OTHER): Payer: 59 | Admitting: Family Medicine

## 2018-10-07 ENCOUNTER — Other Ambulatory Visit: Payer: Self-pay

## 2018-10-07 ENCOUNTER — Encounter: Payer: Self-pay | Admitting: Family Medicine

## 2018-10-07 VITALS — BP 110/80 | HR 80 | Temp 98.5°F | Ht 67.0 in | Wt 208.0 lb

## 2018-10-07 DIAGNOSIS — J302 Other seasonal allergic rhinitis: Secondary | ICD-10-CM

## 2018-10-07 DIAGNOSIS — L308 Other specified dermatitis: Secondary | ICD-10-CM | POA: Diagnosis not present

## 2018-10-07 DIAGNOSIS — J4540 Moderate persistent asthma, uncomplicated: Secondary | ICD-10-CM | POA: Diagnosis not present

## 2018-10-07 DIAGNOSIS — Z7689 Persons encountering health services in other specified circumstances: Secondary | ICD-10-CM

## 2018-10-07 DIAGNOSIS — F17210 Nicotine dependence, cigarettes, uncomplicated: Secondary | ICD-10-CM

## 2018-10-07 MED ORDER — HYDROCORTISONE VALERATE 0.2 % EX OINT: 1.0000 "application " | TOPICAL_OINTMENT | Freq: Two times a day (BID) | CUTANEOUS | 3 refills | Status: DC

## 2018-10-07 MED ORDER — FLUTICASONE-SALMETEROL 115-21 MCG/ACT IN AERO: 2.0000 | INHALATION_SPRAY | Freq: Two times a day (BID) | RESPIRATORY_TRACT | 12 refills | Status: DC

## 2018-10-07 MED ORDER — FEXOFENADINE HCL 180 MG PO TABS: 180.0000 mg | ORAL_TABLET | Freq: Every day | ORAL | 3 refills | Status: DC

## 2018-10-07 NOTE — Patient Instructions (Signed)
Asthma, Adult  Asthma is a long-term (chronic) condition that causes recurrent episodes in which the airways become tight and narrow. The airways are the passages that lead from the nose and mouth down into the lungs. Asthma episodes, also called asthma attacks, can cause coughing, wheezing, shortness of breath, and chest pain. The airways can also fill with mucus. During an attack, it can be difficult to breathe. Asthma attacks can range from minor to life threatening. Asthma cannot be cured, but medicines and lifestyle changes can help control it and treat acute attacks. What are the causes? This condition is believed to be caused by inherited (genetic) and environmental factors, but its exact cause is not known. There are many things that can bring on an asthma attack or make asthma symptoms worse (triggers). Asthma triggers are different for each person. Common triggers include:  Mold.  Dust.  Cigarette smoke.  Cockroaches.  Things that can cause allergy symptoms (allergens), such as animal dander or pollen from trees or grass.  Air pollutants such as household cleaners, wood smoke, smog, or chemical odors.  Cold air, weather changes, and winds (which increase molds and pollen in the air).  Strong emotional expressions such as crying or laughing hard.  Stress.  Certain medicines (such as aspirin) or types of medicines (such as beta-blockers).  Sulfites in foods and drinks. Foods and drinks that may contain sulfites include dried fruit, potato chips, and sparkling grape juice.  Infections or inflammatory conditions such as the flu, a cold, or inflammation of the nasal membranes (rhinitis).  Gastroesophageal reflux disease (GERD).  Exercise or strenuous activity. What are the signs or symptoms? Symptoms of this condition may occur right after asthma is triggered or many hours later. Symptoms include:  Wheezing. This can sound like whistling when you breathe.  Excessive  nighttime or early morning coughing.  Frequent or severe coughing with a common cold.  Chest tightness.  Shortness of breath.  Tiredness (fatigue) with minimal activity. How is this diagnosed? This condition is diagnosed based on:  Your medical history.  A physical exam.  Tests, which may include: ? Lung function studies and pulmonary studies (spirometry). These tests can evaluate the flow of air in your lungs. ? Allergy tests. ? Imaging tests, such as X-rays. How is this treated? There is no cure for this condition, but treatment can help control your symptoms. Treatment for asthma usually involves:  Identifying and avoiding your asthma triggers.  Using medicines to control your symptoms. Generally, two types of medicines are used to treat asthma: ? Controller medicines. These help prevent asthma symptoms from occurring. They are usually taken every day. ? Fast-acting reliever or rescue medicines. These quickly relieve asthma symptoms by widening the narrow and tight airways. They are used as needed and provide short-term relief.  Using supplemental oxygen. This may be needed during a severe episode.  Using other medicines, such as: ? Allergy medicines, such as antihistamines, if your asthma attacks are triggered by allergens. ? Immune medicines (immunomodulators). These are medicines that help control the immune system.  Creating an asthma action plan. An asthma action plan is a written plan for managing and treating your asthma attacks. This plan includes: ? A list of your asthma triggers and how to avoid them. ? Information about when medicines should be taken and when their dosage should be changed. ? Instructions about using a device called a peak flow meter. A peak flow meter measures how well the lungs are working and the   severity of your asthma. It helps you monitor your condition. Follow these instructions at home: Controlling your home environment Control your home  environment in the following ways to help avoid triggers and prevent asthma attacks:  Change your heating and air conditioning filter regularly.  Limit your use of fireplaces and wood stoves.  Get rid of pests (such as roaches and mice) and their droppings.  Throw away plants if you see mold on them.  Clean floors and dust surfaces regularly. Use unscented cleaning products.  Try to have someone else vacuum for you regularly. Stay out of rooms while they are being vacuumed and for a short while afterward. If you vacuum, use a dust mask from a hardware store, a double-layered or microfilter vacuum cleaner bag, or a vacuum cleaner with a HEPA filter.  Replace carpet with wood, tile, or vinyl flooring. Carpet can trap dander and dust.  Use allergy-proof pillows, mattress covers, and box spring covers.  Keep your bedroom a trigger-free room.  Avoid pets and keep windows closed when allergens are in the air.  Wash beddings every week in hot water and dry them in a dryer.  Use blankets that are made of polyester or cotton.  Clean bathrooms and kitchens with bleach. If possible, have someone repaint the walls in these rooms with mold-resistant paint. Stay out of the rooms that are being cleaned and painted.  Wash your hands often with soap and water. If soap and water are not available, use hand sanitizer.  Do not allow anyone to smoke in your home. General instructions  Take over-the-counter and prescription medicines only as told by your health care provider. ? Speak with your health care provider if you have questions about how or when to take the medicines. ? Make note if you are requiring more frequent dosages.  Do not use any products that contain nicotine or tobacco, such as cigarettes and e-cigarettes. If you need help quitting, ask your health care provider. Also, avoid being exposed to secondhand smoke.  Use a peak flow meter as told by your health care provider. Record and  keep track of the readings.  Understand and use the asthma action plan to help minimize, or stop an asthma attack, without needing to seek medical care.  Make sure you stay up to date on your yearly vaccinations as told by your health care provider. This may include vaccines for the flu and pneumonia.  Avoid outdoor activities when allergen counts are high and when air quality is low.  Wear a ski mask that covers your nose and mouth during outdoor winter activities. Exercise indoors on cold days if you can.  Warm up before exercising, and take time for a cool-down period after exercise.  Keep all follow-up visits as told by your health care provider. This is important. Where to find more information  For information about asthma, turn to the Centers for Disease Control and Prevention at www.cdc.gov/asthma/faqs.htm  For air quality information, turn to AirNow at https://airnow.gov/ Contact a health care provider if:  You have wheezing, shortness of breath, or a cough even while you are taking medicine to prevent attacks.  The mucus you cough up (sputum) is thicker than usual.  Your sputum changes from clear or white to yellow, green, gray, or bloody.  Your medicines are causing side effects, such as a rash, itching, swelling, or trouble breathing.  You need to use a reliever medicine more than 2-3 times a week.  Your peak flow reading   is still at 50-79% of your personal best after following your action plan for 1 hour.  You have a fever. Get help right away if:  You are getting worse and do not respond to treatment during an asthma attack.  You are short of breath when at rest or when doing very little physical activity.  You have difficulty eating, drinking, or talking.  You have chest pain or tightness.  You develop a fast heartbeat or palpitations.  You have a bluish color to your lips or fingernails.  You are light-headed or dizzy, or you faint.  Your peak flow  reading is less than 50% of your personal best.  You feel too tired to breathe normally. Summary  Asthma is a long-term (chronic) condition that causes recurrent episodes in which the airways become tight and narrow. These episodes can cause coughing, wheezing, shortness of breath, and chest pain.  Asthma cannot be cured, but medicines and lifestyle changes can help control it and treat acute attacks.  Make sure you understand how to avoid triggers and how and when to use your medicines.  Asthma attacks can range from minor to life threatening. Get help right away if you have an asthma attack and do not respond to treatment with your usual rescue medicines. This information is not intended to replace advice given to you by your health care provider. Make sure you discuss any questions you have with your health care provider. Document Released: 02/03/2005 Document Revised: 04/08/2018 Document Reviewed: 03/10/2016 Elsevier Patient Education  2020 [employer].  Eczema Eczema is a broad term for a group of skin conditions that cause skin to become rough and inflamed. Each type of eczema has different triggers, symptoms, and treatments. Eczema of any type is usually itchy and symptoms range from mild to severe. Eczema and its symptoms are not spread from person to person (are not contagious). It can appear on different parts of the body at different times. Your eczema may not look the same as someone else's eczema. What are the types of eczema? Atopic dermatitis This is a long-term (chronic) skin disease that keeps coming back (recurring). Usual symptoms are dry skin and small, solid pimples that may swell and leak fluid (weep). Contact dermatitis  This happens when something irritates the skin and causes a rash. The irritation can come from substances that you are allergic to (allergens), such as poison ivy, chemicals, or medicines that were applied to your skin. Dyshidrotic eczema This is a  form of eczema on the hands and feet. It shows up as very itchy, fluid-filled blisters. It can affect people of any age, but is more common before age 47. Hand eczema  This causes very itchy areas of skin on the palms and sides of the hands and fingers. This type of eczema is common in industrial jobs where you may be exposed to many different types of irritants. Lichen simplex chronicus This type of eczema occurs when a person constantly scratches one area of the body. Repeated scratching of the area leads to thickened skin (lichenification). Lichen simplex chronicus can occur along with other types of eczema. It is more common in adults, but may be seen in children as well. Nummular eczema This is a common type of eczema. It has no known cause. It typically causes a red, circular, crusty lesion (plaque) that may be itchy. Scratching may become a habit and can cause bleeding. Nummular eczema occurs most often in people of middle-age or older. It most  often affects the hands. Seborrheic dermatitis This is a common skin disease that mainly affects the scalp. It may also affect any oily areas of the body, such as the face, sides of nose, eyebrows, ears, eyelids, and chest. It is marked by small scaling and redness of the skin (erythema). This can affect people of all ages. In infants, this condition is known as Location manager"cradle cap." Stasis dermatitis This is a common skin disease that usually appears on the legs and feet. It most often occurs in people who have a condition that prevents blood from being pumped through the veins in the legs (chronic venous insufficiency). Stasis dermatitis is a chronic condition that needs long-term management. How is eczema diagnosed? Your health care provider will examine your skin and review your medical history. He or she may also give you skin patch tests. These tests involve taking patches that contain possible allergens and placing them on your back. He or she will then  check in a few days to see if an allergic reaction occurred. What are the common treatments? Treatment for eczema is based on the type of eczema you have. Hydrocortisone steroid medicine can relieve itching quickly and help reduce inflammation. This medicine may be prescribed or obtained over-the-counter, depending on the strength of the medicine that is needed. Follow these instructions at home:  Take over-the-counter and prescription medicines only as told by your health care provider.  Use creams or ointments to moisturize your skin. Do not use lotions.  Learn what triggers or irritates your symptoms. Avoid these things.  Treat symptom flare-ups quickly.  Do not itch your skin. This can make your rash worse.  Keep all follow-up visits as told by your health care provider. This is important. Where to find more information  The American Academy of Dermatology: InfoExam.siwww.aad.org  The National Eczema Association: www.nationaleczema.org Contact a health care provider if:  You have serious itching, even with treatment.  You regularly scratch your skin until it bleeds.  Your rash looks different than usual.  Your skin is painful, swollen, or more red than usual.  You have a fever. Summary  There are eight general types of eczema. Each type has different triggers.  Eczema of any type causes itching that may range from mild to severe.  Treatment varies based on the type of eczema you have. Hydrocortisone steroid medicine can help with itching and inflammation.  Protecting your skin is the best way to prevent eczema. Use moisturizers and lotions. Avoid triggers and irritants, and treat flare-ups quickly. This information is not intended to replace advice given to you by your health care provider. Make sure you discuss any questions you have with your health care provider. Document Released: 06/19/2016 Document Revised: 01/16/2017 Document Reviewed: 06/19/2016 Elsevier Patient Education   2020 [employer]  Steps to Quit Smoking Smoking tobacco is the leading cause of preventable death. It can affect almost every organ in the body. Smoking puts you and people around you at risk for many serious, long-lasting (chronic) diseases. Quitting smoking can be hard, but it is one of the best things that you can do for your health. It is never too late to quit. How do I get ready to quit? When you decide to quit smoking, make a plan to help you succeed. Before you quit:  Pick a date to quit. Set a date within the next 2 weeks to give you time to prepare.  Write down the reasons why you are quitting. Keep this list  in places where you will see it often.  Tell your family, friends, and co-workers that you are quitting. Their support is important.  Talk with your doctor about the choices that may help you quit.  Find out if your health insurance will pay for these treatments.  Know the people, places, things, and activities that make you want to smoke (triggers). Avoid them. What first steps can I take to quit smoking?  Throw away all cigarettes at home, at work, and in your car.  Throw away the things that you use when you smoke, such as ashtrays and lighters.  Clean your car. Make sure to empty the ashtray.  Clean your home, including curtains and carpets. What can I do to help me quit smoking? Talk with your doctor about taking medicines and seeing a counselor at the same time. You are more likely to succeed when you do both.  If you are pregnant or breastfeeding, talk with your doctor about counseling or other ways to quit smoking. Do not take medicine to help you quit smoking unless your doctor tells you to do so. To quit smoking: Quit right away  Quit smoking totally, instead of slowly cutting back on how much you smoke over a period of time.  Go to counseling. You are more likely to quit if you go to counseling sessions regularly. Take medicine You may take  medicines to help you quit. Some medicines need a prescription, and some you can buy over-the-counter. Some medicines may contain a drug called nicotine to replace the nicotine in cigarettes. Medicines may:  Help you to stop having the desire to smoke (cravings).  Help to stop the problems that come when you stop smoking (withdrawal symptoms). Your doctor may ask you to use:  Nicotine patches, gum, or lozenges.  Nicotine inhalers or sprays.  Non-nicotine medicine that is taken by mouth. Find resources Find resources and other ways to help you quit smoking and remain smoke-free after you quit. These resources are most helpful when you use them often. They include:  Online chats with a Veterinary surgeoncounselor.  Phone quitlines.  Printed Materials engineerself-help materials.  Support groups or group counseling.  Text messaging programs.  Mobile phone apps. Use apps on your mobile phone or tablet that can help you stick to your quit plan. There are many free apps for mobile phones and tablets as well as websites. Examples include Quit Guide from the [employer] and smokefree.gov  What things can I do to make it easier to quit?   Talk to your family and friends. Ask them to support and encourage you.  Call a phone quitline (1-800-QUIT-NOW), reach out to support groups, or work with a Veterinary surgeoncounselor.  Ask people who smoke to not smoke around you.  Avoid places that make you want to smoke, such as: ? Bars. ? Parties. ? Smoke-break areas at work.  Spend time with people who do not smoke.  Lower the stress in your life. Stress can make you want to smoke. Try these things to help your stress: ? Getting regular exercise. ? Doing deep-breathing exercises. ? Doing yoga. ? Meditating. ? Doing a body scan. To do this, close your eyes, focus on one area of your body at a time from head to toe. Notice which parts of your body are tense. Try to relax the muscles in those areas. How will I feel when I quit smoking? Day 1 to 3  weeks Within the first 24 hours, you may start to have  some problems that come from quitting tobacco. These problems are very bad 2-3 days after you quit, but they do not often last for more than 2-3 weeks. You may get these symptoms:  Mood swings.  Feeling restless, nervous, angry, or annoyed.  Trouble concentrating.  Dizziness.  Strong desire for high-sugar foods and nicotine.  Weight gain.  Trouble pooping (constipation).  Feeling like you may vomit (nausea).  Coughing or a sore throat.  Changes in how the medicines that you take for other issues work in your body.  Depression.  Trouble sleeping (insomnia). Week 3 and afterward After the first 2-3 weeks of quitting, you may start to notice more positive results, such as:  Better sense of smell and taste.  Less coughing and sore throat.  Slower heart rate.  Lower blood pressure.  Clearer skin.  Better breathing.  Fewer sick days. Quitting smoking can be hard. Do not give up if you fail the first time. Some people need to try a few times before they succeed. Do your best to stick to your quit plan, and talk with your doctor if you have any questions or concerns. Summary  Smoking tobacco is the leading cause of preventable death. Quitting smoking can be hard, but it is one of the best things that you can do for your health.  When you decide to quit smoking, make a plan to help you succeed.  Quit smoking right away, not slowly over a period of time.  When you start quitting, seek help from your doctor, family, or friends. This information is not intended to replace advice given to you by your health care provider. Make sure you discuss any questions you have with your health care provider. Document Released: 11/30/2008 Document Revised: 04/23/2018 Document Reviewed: 04/24/2018 Elsevier Patient Education  2020 Else[employer] Inc.  Allergies, Adult An allergy is when your body's defense system (immune system)  overreacts to an otherwise harmless substance (allergen) that you breathe in or eat or something that touches your skin. When you come into contact with something that you are allergic to, your immune system produces certain proteins (antibodies). These proteins cause cells to release chemicals (histamines) that trigger the symptoms of an allergic reaction. Allergies often affect the nasal passages (allergic rhinitis), eyes (allergic conjunctivitis), skin (atopic dermatitis), and stomach. Allergies can be mild or severe. Allergies cannot spread from person to person (are not contagious). They can develop at any age and may be outgrown. What increases the risk? You may be at greater risk of allergies if other people in your family have allergies. What are the signs or symptoms? Symptoms depend on what type of allergy you have. They may include:  Runny, stuffy nose.  Sneezing.  Itchy mouth, ears, or throat.  Postnasal drip.  Sore throat.  Itchy, red, watery, or puffy eyes.  Skin rash or hives.  Stomach pain.  Vomiting.  Diarrhea.  Bloating.  Wheezing or coughing. People with a severe allergy to food, medicine, or an insect bite may have a life-threatening allergic reaction (anaphylaxis). Symptoms of anaphylaxis include:  Hives.  Itching.  Flushed face.  Swollen lips, tongue, or mouth.  Tight or swollen throat.  Chest pain or tightness in the chest.  Trouble breathing or shortness of breath.  Rapid heartbeat.  Dizziness or fainting.  Vomiting.  Diarrhea.  Pain in the abdomen. How is this diagnosed? This condition is diagnosed based on:  Your symptoms.  Your family and medical history.  A physical exam. You may  need to see a health care provider who specializes in treating allergies (allergist). You may also have tests, including:  Skin tests to see which allergens are causing your symptoms, such as: ? Skin prick test. In this test, your skin is pricked  with a tiny needle and exposed to small amounts of possible allergens to see if your skin reacts. ? Intradermal skin test. In this test, a small amount of allergen is injected under your skin to see if your skin reacts. ? Patch test. In this test, a small amount of allergen is placed on your skin and then your skin is covered with a bandage. Your health care provider will check your skin after a couple of days to see if a rash has developed.  Blood tests.  Challenges tests. In this test, you inhale a small amount of allergen by mouth to see if you have an allergic reaction. You may also be asked to:  Keep a food diary. A food diary is a record of all the foods and drinks you have in a day and any symptoms you experience.  Practice an elimination diet. An elimination diet involves eliminating specific foods from your diet and then adding them back in one by one to find out if a certain food causes an allergic reaction. How is this treated? Treatment for allergies depends on your symptoms. Treatment may include:  Cold compresses to soothe itching and swelling.  Eye drops.  Nasal sprays.  Using a saline spray or container (neti pot) to flush out the nose (nasal irrigation). These methods can help clear away mucus and keep the nasal passages moist.  Using a humidifier.  Oral antihistamines or other medicines to block allergic reaction and inflammation.  Skin creams to treat rashes or itching.  Diet changes to eliminate food allergy triggers.  Repeated exposure to tiny amounts of allergens to build up a tolerance and prevent future allergic reactions (immunotherapy). These include: ? Allergy shots. ? Oral treatment. This involves taking small doses of an allergen under the tongue (sublingual immunotherapy).  Emergency epinephrine injection (auto-injector) in case of an allergic emergency. This is a self-injectable, pre-measured medicine that must be given within the first few minutes of  a serious allergic reaction. Follow these instructions at home:         Avoid known allergens whenever possible.  If you suffer from airborne allergens, wash out your nose daily. You can do this with a saline spray or a neti pot to flush out your nose (nasal irrigation).  Take over-the-counter and prescription medicines only as told by your health care provider.  Keep all follow-up visits as told by your health care provider. This is important.  If you are at risk of a severe allergic reaction (anaphylaxis), keep your auto-injector with you at all times.  If you have ever had anaphylaxis, wear a medical alert bracelet or necklace that states you have a severe allergy. Contact a health care provider if:  Your symptoms do not improve with treatment. Get help right away if:  You have symptoms of anaphylaxis, such as: ? Swollen mouth, tongue, or throat. ? Pain or tightness in your chest. ? Trouble breathing or shortness of breath. ? Dizziness or fainting. ? Severe abdominal pain, vomiting, or diarrhea. This information is not intended to replace advice given to you by your health care provider. Make sure you discuss any questions you have with your health care provider. Document Released: 04/29/2002 Document Revised: 04/29/2017 Document Reviewed: 08/22/2015  Elsevier Patient Education  [employer].

## 2018-10-07 NOTE — Progress Notes (Signed)
Patient presents to clinic today to establish care.  SUBJECTIVE: PMH: Pt is a 28 yo female with pmh sig for asthma, eczema, and seasonal allergies.  Pt previously seen by Dr. Maudie Mercury several yrs ago.  Asthma: -notes having to use albuterol BID -symptoms becoming more frequent.  Smoking cigarettes. -not on a maintenance inhaler. -in the past Qvar "triggered my asthma" -Advair worked, but pt did not like the taste.  Nicotine use: -started smoking a few yrs ago -1 pk last 2-3 days -has thought about quitting. -knows would help asthma  Seasonal allergies: -endorses itchy eyes x 3 months. -not taking anything currently -symptoms typically start in the summer -in the past was on Allegra-D, Claritin-D, Flonase, Rhinocort, and Singulair -the nasal sprays made pt feel "stuffy"  Eczema: -pt states her skin has become worse -triamcinolone 0.1% cream no longer working  Social hx:  Pt is working at [employer] at [employer].  Pt states she just started another job.  Past Medical History:  Diagnosis Date  . Allergy   . Asthma   . Eczema     Past Surgical History:  Procedure Laterality Date  . MOUTH SURGERY    . WISDOM TOOTH EXTRACTION      Current Outpatient Medications on File Prior to Visit  Medication Sig Dispense Refill  . albuterol (VENTOLIN HFA) 108 (90 Base) MCG/ACT inhaler Inhale 1-2 puffs into the lungs every 6 (six) hours as needed for wheezing. 1 g 1  . predniSONE (DELTASONE) 20 MG tablet 3 tabs po daily x 4 days STARTING 03/07/2018 12 tablet 0  . predniSONE (STERAPRED UNI-PAK 21 TAB) 10 MG (21) TBPK tablet Take by mouth daily. Take 6 tabs by mouth daily  for 1 days, then 5 tabs for 2 days, then 4 tabs for 2 days, then 3 tabs for 2 days, 2 tabs for 2 days, then 1 tab by mouth daily for 2 days 36 tablet 0  . triamcinolone cream (KENALOG) 0.1 % Apply 1 application topically 2 (two) times daily.     No current facility-administered medications on file prior to visit.      Allergies  Allergen Reactions  . Peanuts [Peanut Oil] Anaphylaxis, Hives and Itching  . Sulfa Antibiotics Nausea And Vomiting    Family History  Problem Relation Age of Onset  . Breast cancer Maternal Grandfather   . Hypertension Maternal Grandfather     Social History   Socioeconomic History  . Marital status: Single    Spouse name: Not on file  . Number of children: Not on file  . Years of education: Not on file  . Highest education level: Not on file  Occupational History  . Occupation: overnight stoker at [employer]  . Financial resource strain: Not on file  . Food insecurity    Worry: Not on file    Inability: Not on file  . Transportation needs    Medical: Not on file    Non-medical: Not on file  Tobacco Use  . Smoking status: Current Some Day Smoker    Packs/day: 0.25    Years: 3.00    Pack years: 0.75    Types: Cigarettes  . Smokeless tobacco: Never Used  . Tobacco comment: Pt stated she smoked intermittently for 3 years.   Substance and Sexual Activity  . Alcohol use: Yes    Alcohol/week: 0.0 standard drinks    Comment: occ, 2-3 drinks   . Drug use: No  . Sexual activity: Never  Lifestyle  . Physical activity    Days per week: Not on file    Minutes per session: Not on file  . Stress: Not on file  Relationships  . Social Musicianconnections    Talks on phone: Not on file    Gets together: Not on file    Attends religious service: Not on file    Active member of club or organization: Not on file    Attends meetings of clubs or organizations: Not on file    Relationship status: Not on file  . Intimate partner violence    Fear of current or ex partner: Not on file    Emotionally abused: Not on file    Physically abused: Not on file    Forced sexual activity: Not on file  Other Topics Concern  . Not on file  Social History Narrative   Work or School: [employer]      Home Situation: lives with sister      Spiritual Beliefs: none       Lifestyle: walking; poor diet             ROS General: Denies fever, chills, night sweats, changes in weight, changes in appetite HEENT: Denies headaches, ear pain, changes in vision, rhinorrhea, sore throat +itchy, watery eyes. CV: Denies CP, palpitations, SOB, orthopnea Pulm: Denies SOB, cough  +wheezing GI: Denies abdominal pain, nausea, vomiting, diarrhea, constipation GU: Denies dysuria, hematuria, frequency, vaginal discharge Msk: Denies muscle cramps, joint pains Neuro: Denies weakness, numbness, tingling Skin: Denies rashes, bruising  +eczema, dry, itchy skin Psych: Denies depression, anxiety, hallucinations   BP 110/80 (BP Location: Left Arm, Patient Position: Sitting, Cuff Size: Normal)   Pulse 80   Temp 98.5 F (36.9 C) (Temporal)   Ht 5\' 7"  (1.702 m)   Wt 208 lb (94.3 kg)   SpO2 98%   BMI 32.58 kg/m   Physical Exam Gen. Pleasant, well developed, well-nourished, in NAD HEENT - [place]/AT, PERRL, eyelid edema b/l, EOMI, conjunctive clear, no scleral icterus, no nasal drainage Lungs: no use of accessory muscles, CTAB, no wheezes, rales or rhonchi Cardiovascular: RRR, No r/g/m, no peripheral edema Abdomen: BS present, soft, nontender, nondistended Musculoskeletal: No deformities, moves all four extremities, no cyanosis or clubbing, normal tone Neuro:  A&Ox3, CN II-XII intact, normal gait Skin:  Warm, dry, intact.  Eczematous plaques on UEs.  No results found for this or any previous visit (from the past 2160 hour(s)).  Assessment/Plan: Other eczema -discussed skin care  -given handout - Plan: hydrocortisone valerate ointment (WESTCORT) 0.2 %  Moderate persistent asthma without complication  -discussed various medication options.  Pt wishes to restart advair.  Advised to rinse mouth after using. -continue albuterol inhaler prn - Plan: fluticasone-salmeterol (ADVAIR HFA) 115-21 MCG/ACT inhaler  Cigarette nicotine dependence without complication -smoking cessation  >3 min, <10 min -discussed cutting down -consider medications options for quitting -will readdress at each visit.  Seasonal allergies  - Plan: fexofenadine (ALLEGRA) 180 MG tablet  F/u prn  Abbe AmsterdamShannon Stelios Kirby, MD

## 2018-10-10 ENCOUNTER — Encounter: Payer: Self-pay | Admitting: Family Medicine

## 2019-02-28 ENCOUNTER — Other Ambulatory Visit: Payer: Self-pay | Admitting: Family Medicine

## 2019-02-28 NOTE — Telephone Encounter (Signed)
Medication: albuterol (VENTOLIN HFA) 108 (90 Base) MCG/ACT inhaler [[id number]]   Has the patient contacted their pharmacy?  YES (Agent: If no, request that the patient contact the pharmacy for the refill.) (Agent: If yes, when and what did the pharmacy advise?)  Preferred Pharmacy (with phone number or street name): [id number] - [place], [place] - [address] AT [facility] OF [address]  Phone:  [phone] Fax:  [phone]     Agent: Please be advised that RX refills may take up to 3 business days. We ask that you follow-up with your pharmacy.

## 2019-02-28 NOTE — Telephone Encounter (Signed)
Requested medication (s) are due for refill today: yes  Requested medication (s) are on the active medication list: yes  Last refill:  08/30/18  Future visit scheduled: no  Notes to clinic:  originally prescribed from ED encounter   Requested Prescriptions  Pending Prescriptions Disp Refills   albuterol (VENTOLIN HFA) 108 (90 Base) MCG/ACT inhaler 1 g 1    Sig: Inhale 1-2 puffs into the lungs every 6 (six) hours as needed for wheezing.      Pulmonology:  Beta Agonists Failed - 02/28/2019  4:50 PM      Failed - One inhaler should last at least one month. If the patient is requesting refills earlier, contact the patient to check for uncontrolled symptoms.      Passed - Valid encounter within last 12 months    Recent Outpatient Visits           4 months ago Other eczema   Nature conservation officer at [facility] [employer], Bettey Mare, MD   3 years ago Allergic rhinitis, unspecified allergic rhinitis type   Nature conservation officer at [facility], [place] R, DO   3 years ago Allergic rhinitis, unspecified allergic rhinitis type   Nature conservation officer at [facility], [place] R, DO   3 years ago Asthma, moderate persistent, uncomplicated   Nature conservation officer at [facility], [employer], DO   4 years ago Visit for preventive health examination   Nature conservation officer at [facility], [place] R, DO

## 2019-03-01 MED ORDER — ALBUTEROL SULFATE HFA 108 (90 BASE) MCG/ACT IN AERS: 1.0000 | INHALATION_SPRAY | Freq: Four times a day (QID) | RESPIRATORY_TRACT | 2 refills | Status: DC | PRN

## 2019-03-01 NOTE — Telephone Encounter (Signed)
Message sent to PCP as last Rx given during ER visit.

## 2019-07-03 ENCOUNTER — Other Ambulatory Visit: Payer: Self-pay

## 2019-07-03 ENCOUNTER — Emergency Department (HOSPITAL_COMMUNITY): Payer: 59

## 2019-07-03 ENCOUNTER — Emergency Department (HOSPITAL_COMMUNITY)
Admission: EM | Admit: 2019-07-03 | Discharge: 2019-07-03 | Disposition: A | Discharge status: Home / Self Care | Payer: 59 | Attending: Emergency Medicine | Admitting: Emergency Medicine

## 2019-07-03 ENCOUNTER — Encounter (HOSPITAL_COMMUNITY): Payer: Self-pay | Admitting: Emergency Medicine

## 2019-07-03 DIAGNOSIS — Y92410 Unspecified street and highway as the place of occurrence of the external cause: Secondary | ICD-10-CM | POA: Diagnosis not present

## 2019-07-03 DIAGNOSIS — Y93I9 Activity, other involving external motion: Secondary | ICD-10-CM | POA: Diagnosis not present

## 2019-07-03 DIAGNOSIS — F17210 Nicotine dependence, cigarettes, uncomplicated: Secondary | ICD-10-CM | POA: Insufficient documentation

## 2019-07-03 DIAGNOSIS — M25522 Pain in left elbow: Secondary | ICD-10-CM | POA: Insufficient documentation

## 2019-07-03 DIAGNOSIS — Z91010 Allergy to peanuts: Secondary | ICD-10-CM | POA: Insufficient documentation

## 2019-07-03 DIAGNOSIS — J45909 Unspecified asthma, uncomplicated: Secondary | ICD-10-CM | POA: Diagnosis not present

## 2019-07-03 DIAGNOSIS — Y999 Unspecified external cause status: Secondary | ICD-10-CM | POA: Diagnosis not present

## 2019-07-03 DIAGNOSIS — M79671 Pain in right foot: Secondary | ICD-10-CM | POA: Insufficient documentation

## 2019-07-03 DIAGNOSIS — Z79899 Other long term (current) drug therapy: Secondary | ICD-10-CM | POA: Diagnosis not present

## 2019-07-03 MED ORDER — IBUPROFEN 800 MG PO TABS: 800.0000 mg | ORAL_TABLET | Freq: Three times a day (TID) | ORAL | 0 refills | Status: AC

## 2019-07-03 MED ORDER — CYCLOBENZAPRINE HCL 10 MG PO TABS: 10.0000 mg | ORAL_TABLET | Freq: Two times a day (BID) | ORAL | 0 refills | Status: DC | PRN
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
## 2019-07-03 NOTE — ED Notes (Signed)
Pt a fast track pt, see provider's assessment.
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
## 2019-07-08 ENCOUNTER — Encounter: Payer: Self-pay | Admitting: Family Medicine

## 2019-07-08 ENCOUNTER — Telehealth (INDEPENDENT_AMBULATORY_CARE_PROVIDER_SITE_OTHER): Payer: 59 | Admitting: Family Medicine

## 2019-07-08 DIAGNOSIS — T148XXA Other injury of unspecified body region, initial encounter: Secondary | ICD-10-CM

## 2019-07-08 DIAGNOSIS — M545 Low back pain, unspecified: Secondary | ICD-10-CM

## 2019-07-08 DIAGNOSIS — M25511 Pain in right shoulder: Secondary | ICD-10-CM | POA: Diagnosis not present

## 2019-07-08 DIAGNOSIS — M25512 Pain in left shoulder: Secondary | ICD-10-CM
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
## 2019-08-03 ENCOUNTER — Other Ambulatory Visit: Payer: Self-pay

## 2019-08-04 ENCOUNTER — Ambulatory Visit (INDEPENDENT_AMBULATORY_CARE_PROVIDER_SITE_OTHER): Payer: 59 | Admitting: Family Medicine

## 2019-08-04 ENCOUNTER — Encounter: Payer: Self-pay | Admitting: Family Medicine

## 2019-08-04 VITALS — BP 118/80 | HR 80 | Temp 96.6°F | Wt 231.0 lb

## 2019-08-04 DIAGNOSIS — M5432 Sciatica, left side: Secondary | ICD-10-CM | POA: Diagnosis not present

## 2019-08-04 MED ORDER — CYCLOBENZAPRINE HCL 10 MG PO TABS: 10.0000 mg | ORAL_TABLET | Freq: Two times a day (BID) | ORAL | 0 refills | Status: AC | PRN
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
## 2019-10-03 ENCOUNTER — Other Ambulatory Visit: Payer: Self-pay | Admitting: Family Medicine

## 2020-01-04 ENCOUNTER — Other Ambulatory Visit: Payer: Self-pay | Admitting: Family Medicine

## 2020-02-25 ENCOUNTER — Emergency Department (HOSPITAL_COMMUNITY)
Admission: EM | Admit: 2020-02-25 | Discharge: 2020-02-25 | Disposition: A | Discharge status: Home / Self Care | Payer: BC Managed Care – PPO | Attending: Emergency Medicine | Admitting: Emergency Medicine

## 2020-02-25 ENCOUNTER — Other Ambulatory Visit: Payer: Self-pay

## 2020-02-25 ENCOUNTER — Encounter (HOSPITAL_COMMUNITY): Payer: Self-pay | Admitting: *Deleted

## 2020-02-25 DIAGNOSIS — Z91010 Allergy to peanuts: Secondary | ICD-10-CM | POA: Diagnosis not present

## 2020-02-25 DIAGNOSIS — F17210 Nicotine dependence, cigarettes, uncomplicated: Secondary | ICD-10-CM | POA: Diagnosis not present

## 2020-02-25 DIAGNOSIS — Z20822 Contact with and (suspected) exposure to covid-19: Secondary | ICD-10-CM

## 2020-02-25 DIAGNOSIS — J4540 Moderate persistent asthma, uncomplicated: Secondary | ICD-10-CM | POA: Diagnosis not present

## 2020-02-25 DIAGNOSIS — R509 Fever, unspecified: Secondary | ICD-10-CM | POA: Diagnosis present

## 2020-02-25 DIAGNOSIS — Z7952 Long term (current) use of systemic steroids: Secondary | ICD-10-CM | POA: Diagnosis not present

## 2020-02-25 DIAGNOSIS — U071 COVID-19: Secondary | ICD-10-CM | POA: Diagnosis not present

## 2020-02-25 LAB — SARS CORONAVIRUS 2 (TAT 6-24 HRS): SARS Coronavirus 2: POSITIVE — AB

## 2020-02-25 MED ORDER — ACETAMINOPHEN 500 MG PO TABS
1000.0000 mg | ORAL_TABLET | Freq: Once | ORAL | Status: AC
  Administered 2020-02-25: 1000 mg via ORAL
  Filled 2020-02-25: qty 2

## 2020-02-25 MED ORDER — ALBUTEROL SULFATE HFA 108 (90 BASE) MCG/ACT IN AERS
1.0000 | INHALATION_SPRAY | Freq: Once | RESPIRATORY_TRACT | Status: AC
  Administered 2020-02-25: 2 via RESPIRATORY_TRACT
  Filled 2020-02-25: qty 6.7

## 2020-02-25 NOTE — ED Provider Notes (Signed)
[facility]-EMERGENCY DEPT Provider Note   CSN: [id number] Arrival date & time: 02/25/20  1052     History Chief Complaint  Patient presents with  . flu like symptoms    Connie Payne is a 30 y.o. 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  She states she has felt fatigued and tired.  She states cough is productive of phlegm.  She has not measured a Production designer, theatre/television/film but states she has felt hot and cold.  She states she got 1 vaccine in September.  She did not finish the vaccines.  She does not know of any COVID exposure.  Denies any difficulty breathing, vomiting.  She does have a history of asthma.  She does smoke cigarettes.  The history is provided by the patient.       Past Medical History:  Diagnosis Date  . Allergy   . Asthma   . Eczema     Patient Active Problem List   Diagnosis Date Noted  . Rhinitis, allergic 05/04/2015  . Asthma, moderate persistent 05/04/2015  . Tobacco use disorder 05/04/2015  . Eczema 05/04/2015    Past Surgical History:  Procedure Laterality Date  . MOUTH SURGERY    . WISDOM TOOTH EXTRACTION       OB History   No obstetric history on file.     Family History  Problem Relation Age of Onset  . Breast cancer Maternal Grandfather   . Hypertension Maternal Grandfather     Social History   Tobacco Use  . Smoking status: Current Some Day Smoker    Packs/day: 0.25    Years: 3.00    Pack years: 0.75    Types: Cigarettes  . Smokeless tobacco: Never Used  . Tobacco comment: Pt stated she smoked intermittently for 3 years.   Vaping Use  . Vaping Use: Never used  Substance Use Topics  . Alcohol use: Yes    Alcohol/week: 0.0 standard drinks    Comment: occ, 2-3 drinks   . Drug use: No    Home Medications Prior to Admission medications   Medication Sig Start Date End Date Taking?  Authorizing Provider  albuterol (VENTOLIN HFA) 108 (90 Base) MCG/ACT inhaler INHALE 1 TO 2 PUFFS INTO THE LUNGS EVERY 6 HOURS AS NEEDED FOR WHEEZING 01/04/20   Deeann Saint, MD  fexofenadine (ALLEGRA) 180 MG tablet Take 1 tablet (180 mg total) by mouth daily. 10/07/18   Deeann Saint, MD  fluticasone-salmeterol (ADVAIR HFA) 448-18 MCG/ACT inhaler Inhale 2 puffs into the lungs 2 (two) times daily. 10/07/18   Deeann Saint, MD  hydrocortisone valerate ointment (WESTCORT) 0.2 % Apply 1 application topically 2 (two) times daily. 10/07/18   Deeann Saint, MD  ibuprofen (ADVIL) 800 MG tablet Take 1 tablet (800 mg total) by mouth 3 (three) times daily. 07/03/19   Roxy Horseman, PA-C  triamcinolone cream (KENALOG) 0.1 % Apply 1 application topically 2 (two) times daily.    [provider]    Allergies    Peanuts [peanut oil] and Sulfa antibiotics  Review of Systems   Review of Systems  Constitutional: Positive for chills and fatigue. Negative for fever.  HENT: Positive for congestion and rhinorrhea. Negative for sore throat and trouble swallowing.   Respiratory: Positive for cough. Negative for shortness of breath.   Cardiovascular: Negative for chest pain.  Gastrointestinal: Negative for vomiting.  All other  systems reviewed and are negative.   Physical Exam Updated Vital Signs BP 134/84 (BP Location: Left Arm)   Pulse (!) 117   Temp (!) 100.5 F (38.1 C) (Oral)   Resp 18   Ht 5\' 7"  (1.702 m)   Wt 104.3 kg   SpO2 95%   BMI 36.02 kg/m   Physical Exam Vitals and nursing note reviewed.  Constitutional:      Appearance: She is well-developed and well-nourished.  HENT:     Head: Normocephalic and atraumatic.     Mouth/Throat:     Comments: Posterior oropharynx is clear, without erythema, edema or exudates.  Eyes:     General: No scleral icterus.       Right eye: No discharge.        Left eye: No discharge.     Extraocular Movements: EOM normal.      Conjunctiva/sclera: Conjunctivae normal.  Pulmonary:     Effort: Pulmonary effort is normal.     Comments: Lungs clear to auscultation bilaterally.  Symmetric chest rise.  No wheezing, rales, rhonchi. Skin:    General: Skin is warm and dry.  Neurological:     Mental Status: She is alert.  Psychiatric:        Mood and Affect: Mood and affect normal.        Speech: Speech normal.        Behavior: Behavior normal.     ED Results / Procedures / Treatments   Labs (all labs ordered are listed, but only abnormal results are displayed) Labs Reviewed  SARS CORONAVIRUS 2 (TAT 6-24 HRS)    EKG None  Radiology No results found.  Procedures Procedures (including critical care time)  Medications Ordered in ED Medications  acetaminophen (TYLENOL) tablet 1,000 mg (has no administration in time range)  albuterol (VENTOLIN HFA) 108 (90 Base) MCG/ACT inhaler 1-2 puff (has no administration in time range)    ED Course  I have reviewed the triage vital signs and the nursing notes.  Pertinent labs & imaging results that were available during my care of the patient were reviewed by me and considered in my medical decision making (see chart for details).    MDM Rules/Calculators/A&P                          30 year old female who presents for evaluation of 3 days of generalized body aches, subjective fever/chills, cough, rhinorrhea, congestion.  She had vaccine x1.  No COVID exposure that she knows of.  On initial arrival, she has a low-grade fever.  Vitals otherwise stable.  On exam, she is well-appearing.  No evidence of respiratory distress.  Concern for viral process versus COVID-19.  COVID test ordered at triage.  Patient instructed that she has a COVID test pending and that if she is positive, she will need to quarantine.  Encouraged home supportive care measures.  Patient provided albuterol inhaler given history of asthma. At this time, patient exhibits no emergent life-threatening  condition that require further evaluation in ED. Patient had ample opportunity for questions and discussion. All patient's questions were answered with full understanding. Strict return precautions discussed. Patient expresses understanding and agreement to plan.   Connie Payne was evaluated in Emergency Department on 02/25/2020 for the symptoms described in the history of present illness. She was evaluated in the context of the global COVID-19 pandemic, which necessitated consideration that the patient might be at risk for infection with the SARS-CoV-2  virus that causes COVID-19. Institutional protocols and algorithms that pertain to the evaluation of patients at risk for COVID-19 are in a state of rapid change based on information released by regulatory bodies including the CDC and federal and state organizations. These policies and algorithms were followed during the patient's care in the ED.  Portions of this note were generated with Scientist, clinical (histocompatibility and immunogenetics). Dictation errors may occur despite best attempts at proofreading.   Final Clinical Impression(s) / ED Diagnoses Final diagnoses:  Suspected COVID-19 virus infection    Rx / DC Orders ED Discharge Orders    None       Rosana Hoes 02/25/20 1504    Gerhard Munch, MD 02/25/20 [phone]

## 2020-02-25 NOTE — Discharge Instructions (Signed)
You have a COVID test pending. This may take several hours to come back. You can check online or use the MyChart App to look at your results.   If you are still having symptoms after 4-5 days you will need to get retested as sometimes the viral load is not high enough to show positive on a test.   You can take Tylenol or Ibuprofen as directed for pain. You can alternate Tylenol and Ibuprofen every 4 hours. If you take Tylenol at 1pm, then you can take Ibuprofen at 5pm. Then you can take Tylenol again at 9pm.   Make sure you are staying hydrated and drinking plenty of fluids.   Return to the Emergency Dept for any difficulty breathing, vomiting/inability to keep any food or liquid downs, chest pain or any other worsening or concerning symptoms.

## 2020-02-25 NOTE — ED Triage Notes (Signed)
Flu like symptoms since yesterday. Tried to define symptoms "anything that has to do with the flu"

## 2020-06-17 ENCOUNTER — Other Ambulatory Visit: Payer: Self-pay | Admitting: Family Medicine

## 2020-10-28 ENCOUNTER — Other Ambulatory Visit: Payer: Self-pay | Admitting: Family Medicine

## 2020-10-29 ENCOUNTER — Other Ambulatory Visit: Payer: Self-pay | Admitting: Family Medicine

## 2020-10-29 NOTE — Telephone Encounter (Signed)
PT needs a refill of their albuterol (VENTOLIN HFA) 108 (90 Base) MCG/ACT inhaler asap as they are already having trouble with breathing due to frustration trying to get it refill. They are currently out of their medication. Please send to the [facility] on file.

## 2020-10-29 NOTE — Telephone Encounter (Signed)
Refill has already been sent to [facility].

## 2020-11-01 ENCOUNTER — Ambulatory Visit (INDEPENDENT_AMBULATORY_CARE_PROVIDER_SITE_OTHER): Payer: BC Managed Care – PPO | Admitting: Family Medicine

## 2020-11-01 ENCOUNTER — Other Ambulatory Visit: Payer: Self-pay

## 2020-11-01 ENCOUNTER — Encounter: Payer: Self-pay | Admitting: Family Medicine

## 2020-11-01 VITALS — BP 118/84 | HR 84 | Temp 98.5°F | Ht 67.0 in | Wt 214.6 lb

## 2020-11-01 DIAGNOSIS — Z13220 Encounter for screening for lipoid disorders: Secondary | ICD-10-CM

## 2020-11-01 DIAGNOSIS — E049 Nontoxic goiter, unspecified: Secondary | ICD-10-CM

## 2020-11-01 DIAGNOSIS — M62830 Muscle spasm of back: Secondary | ICD-10-CM | POA: Diagnosis not present

## 2020-11-01 DIAGNOSIS — L309 Dermatitis, unspecified: Secondary | ICD-10-CM | POA: Diagnosis not present

## 2020-11-01 DIAGNOSIS — Z0000 Encounter for general adult medical examination without abnormal findings: Secondary | ICD-10-CM | POA: Diagnosis not present

## 2020-11-01 LAB — CBC WITH DIFFERENTIAL/PLATELET
Basophils Absolute: 0 10*3/uL (ref 0.0–0.1)
Basophils Relative: 0.5 % (ref 0.0–3.0)
Eosinophils Absolute: 0.3 10*3/uL (ref 0.0–0.7)
Eosinophils Relative: 5.5 % — ABNORMAL HIGH (ref 0.0–5.0)
HCT: 37 % (ref 36.0–46.0)
Hemoglobin: 12.2 g/dL (ref 12.0–15.0)
Lymphocytes Relative: 48.8 % — ABNORMAL HIGH (ref 12.0–46.0)
Lymphs Abs: 2.4 10*3/uL (ref 0.7–4.0)
MCHC: 32.9 g/dL (ref 30.0–36.0)
MCV: 87.6 fl (ref 78.0–100.0)
Monocytes Absolute: 0.5 10*3/uL (ref 0.1–1.0)
Monocytes Relative: 9.7 % (ref 3.0–12.0)
Neutro Abs: 1.7 10*3/uL (ref 1.4–7.7)
Neutrophils Relative %: 35.5 % — ABNORMAL LOW (ref 43.0–77.0)
Platelets: 312 10*3/uL (ref 150.0–400.0)
RBC: 4.22 Mil/uL (ref 3.87–5.11)
RDW: 13.3 % (ref 11.5–15.5)
WBC: 4.9 10*3/uL (ref 4.0–10.5)

## 2020-11-01 LAB — COMPREHENSIVE METABOLIC PANEL
ALT: 12 U/L (ref 0–35)
AST: 29 U/L (ref 0–37)
Albumin: 4.3 g/dL (ref 3.5–5.2)
Alkaline Phosphatase: 108 U/L (ref 39–117)
BUN: 7 mg/dL (ref 6–23)
CO2: 25 mEq/L (ref 19–32)
Calcium: 9.2 mg/dL (ref 8.4–10.5)
Chloride: 106 mEq/L (ref 96–112)
Creatinine, Ser: 0.64 mg/dL (ref 0.40–1.20)
GFR: 117.94 mL/min (ref >60.00)
Glucose, Bld: 84 mg/dL (ref 70–99)
Potassium: 3.7 mEq/L (ref 3.5–5.1)
Sodium: 138 mEq/L (ref 135–145)
Total Bilirubin: 0.3 mg/dL (ref 0.2–1.2)
Total Protein: 7.4 g/dL (ref 6.0–8.3)

## 2020-11-01 LAB — LIPID PANEL
Cholesterol: 137 mg/dL (ref 0–200)
HDL: 43.6 mg/dL (ref >39.00)
LDL Cholesterol: 86 mg/dL (ref 0–99)
NonHDL: 93.58
Total CHOL/HDL Ratio: 3
Triglycerides: 36 mg/dL (ref 0.0–149.0)
VLDL: 7.2 mg/dL (ref 0.0–40.0)

## 2020-11-01 LAB — HEMOGLOBIN A1C: Hgb A1c MFr Bld: 5.2 % (ref 4.6–6.5)

## 2020-11-01 LAB — TSH: TSH: 1.86 u[IU]/mL (ref 0.35–5.50)

## 2020-11-01 LAB — T4, FREE: Free T4: 1.04 ng/dL (ref 0.60–1.60)

## 2020-11-01 MED ORDER — FLUOCINONIDE EMULSIFIED BASE 0.05 % EX CREA: 1.0000 "application " | TOPICAL_CREAM | Freq: Two times a day (BID) | CUTANEOUS | 0 refills | Status: DC
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
## 2020-11-02 ENCOUNTER — Ambulatory Visit (HOSPITAL_BASED_OUTPATIENT_CLINIC_OR_DEPARTMENT_OTHER)
Admission: RE | Admit: 2020-11-02 | Discharge: 2020-11-02 | Disposition: A | Discharge status: Home / Self Care | Payer: BC Managed Care – PPO | Source: Ambulatory Visit | Attending: Family Medicine | Admitting: Family Medicine

## 2020-11-02 DIAGNOSIS — E049 Nontoxic goiter, unspecified: Secondary | ICD-10-CM | POA: Insufficient documentation

## 2020-11-26 ENCOUNTER — Other Ambulatory Visit: Payer: Self-pay | Admitting: Family Medicine

## 2020-11-26 MED ORDER — ALBUTEROL SULFATE HFA 108 (90 BASE) MCG/ACT IN AERS: 1.0000 | INHALATION_SPRAY | Freq: Four times a day (QID) | RESPIRATORY_TRACT | 2 refills | Status: DC | PRN

## 2021-02-08 ENCOUNTER — Other Ambulatory Visit: Payer: Self-pay | Admitting: Family Medicine

## 2021-03-04 ENCOUNTER — Other Ambulatory Visit: Payer: Self-pay | Admitting: Family Medicine

## 2021-08-27 ENCOUNTER — Other Ambulatory Visit: Payer: Self-pay | Admitting: Family Medicine

## 2021-09-27 ENCOUNTER — Telehealth: Payer: BC Managed Care – PPO | Admitting: Physician Assistant

## 2021-09-27 DIAGNOSIS — L03213 Periorbital cellulitis: Secondary | ICD-10-CM | POA: Diagnosis not present

## 2021-09-27 MED ORDER — AMOXICILLIN-POT CLAVULANATE 875-125 MG PO TABS: 1.0000 | ORAL_TABLET | Freq: Two times a day (BID) | ORAL | 0 refills | Status: DC

## 2021-09-27 NOTE — Progress Notes (Signed)
Virtual Visit Consent   Connie Payne, you are scheduled for a virtual visit with a [facility] provider today. Just as with appointments in the office, your consent must be obtained to participate. Your consent will be active for this visit and any virtual visit you may have with one of our providers in the next 365 days. If you have a MyChart account, a copy of this consent can be sent to you electronically.  As this is a virtual visit, video technology does not allow for your provider to perform a traditional examination. This may limit your provider's ability to fully assess your condition. If your provider identifies any concerns that need to be evaluated in person or the need to arrange testing (such as labs, EKG, etc.), we will make arrangements to do so. Although advances in technology are sophisticated, we cannot ensure that it will always work on either your end or our end. If the connection with a video visit is poor, the visit may have to be switched to a telephone visit. With either a video or telephone visit, we are not always able to ensure that we have a secure connection.  By engaging in this virtual visit, you consent to the provision of healthcare and authorize for your insurance to be billed (if applicable) for the services provided during this visit. Depending on your insurance coverage, you may receive a charge related to this service.  I need to obtain your verbal consent now. Are you willing to proceed with your visit today? Connie Payne has provided verbal consent on 09/27/2021 for a virtual visit (video or telephone). Margaretann Loveless, PA-C  Date: 09/27/2021 3:39 PM  Virtual Visit via Video Note   I, Margaretann Loveless, connected with  Connie Payne  ([id number], 06/05/1990) on 09/27/21 at  3:30 PM EDT by a video-enabled telemedicine application and verified that I am speaking with the correct person using two  identifiers.  Location: Patient: Virtual Visit Location Patient: Home Provider: Virtual Visit Location Provider: Home Office   I discussed the limitations of evaluation and management by telemedicine and the availability of in person appointments. The patient expressed understanding and agreed to proceed.    History of Present Illness: Connie Payne is a 31 y.o. who identifies as a female who was assigned female at birth, and is being seen today for possible pink eye.  HPI: Conjunctivitis  The current episode started 2 days ago. The onset was gradual. The problem occurs continuously. The problem has been gradually worsening. The problem is mild. Nothing relieves the symptoms. Nothing aggravates the symptoms. Associated symptoms include decreased vision (occasionally due to drainage), eye itching (occasionally), headaches, eye discharge, eye pain (sore under eye where it is swollen) and eye redness. Pertinent negatives include no fever, no double vision, no photophobia, no congestion, no ear pain, no rhinorrhea, no sore throat and no URI. The eye pain is mild. The right eye is affected. The eye pain is not associated with movement.     Problems:  Patient Active Problem List   Diagnosis Date Noted   Rhinitis, allergic 05/04/2015   Asthma, moderate persistent 05/04/2015   Tobacco use disorder 05/04/2015   Eczema 05/04/2015    Allergies:  Allergies  Allergen Reactions   Peanuts [Peanut Oil] Anaphylaxis, Hives and Itching   Sulfa Antibiotics Nausea And Vomiting   Medications:  Current Outpatient Medications:    amoxicillin-clavulanate (AUGMENTIN) 875-125 MG tablet, Take 1 tablet by mouth 2 (two) times  daily., Disp: 20 tablet, Rfl: 0   albuterol (VENTOLIN HFA) 108 (90 Base) MCG/ACT inhaler, INHALE 1 TO 2 PUFFS BY MOUTH EVERY 6 HOURS AS NEEDED FOR WHEEZING, Disp: 7 each, Rfl: 0   fexofenadine (ALLEGRA) 180 MG tablet, Take 1 tablet (180 mg total) by mouth daily., Disp: 90  tablet, Rfl: 3   fluocinonide-emollient (LIDEX-E) 0.05 % cream, Apply 1 application topically 2 (two) times daily., Disp: 60 g, Rfl: 0   fluticasone-salmeterol (ADVAIR HFA) 115-21 MCG/ACT inhaler, Inhale 2 puffs into the lungs 2 (two) times daily., Disp: 1 Inhaler, Rfl: 12   ibuprofen (ADVIL) 800 MG tablet, Take 1 tablet (800 mg total) by mouth 3 (three) times daily., Disp: 21 tablet, Rfl: 0  Observations/Objective: Patient is well-developed, well-nourished in no acute distress.  Resting comfortably at home.  Head is normocephalic, atraumatic.  No labored breathing.  Speech is clear and coherent with logical content.  Patient is alert and oriented at baseline.    Assessment and Plan: 1. Preseptal cellulitis of right lower eyelid - amoxicillin-clavulanate (AUGMENTIN) 875-125 MG tablet; Take 1 tablet by mouth 2 (two) times daily.  Dispense: 20 tablet; Refill: 0  - Worsening symptoms that have not responded to OTC medications.  - Will give Augmentin - Continue tylenol and/or ibuprofen alternating as needed  - Steam and humidifier can help - Warm compresses - Stay well hydrated and get plenty of rest.  - Seek in person evaluation if no symptom improvement or if symptoms worsen   Follow Up Instructions: I discussed the assessment and treatment plan with the patient. The patient was provided an opportunity to ask questions and all were answered. The patient agreed with the plan and demonstrated an understanding of the instructions.  A copy of instructions were sent to the patient via MyChart unless otherwise noted below.    The patient was advised to call back or seek an in-person evaluation if the symptoms worsen or if the condition fails to improve as anticipated.  Time:  I spent 12 minutes with the patient via telehealth technology discussing the above problems/concerns.    Margaretann Loveless, PA-C

## 2021-09-27 NOTE — Patient Instructions (Signed)
Connie Payne, thank you for joining Margaretann Loveless, PA-C for today's virtual visit.  While this provider is not your primary care provider (PCP), if your PCP is located in our provider database this encounter information will be shared with them immediately following your visit.  Consent: (Patient) Connie Payne provided verbal consent for this virtual visit at the beginning of the encounter.  Current Medications:  Current Outpatient Medications:    amoxicillin-clavulanate (AUGMENTIN) 875-125 MG tablet, Take 1 tablet by mouth 2 (two) times daily., Disp: 20 tablet, Rfl: 0   albuterol (VENTOLIN HFA) 108 (90 Base) MCG/ACT inhaler, INHALE 1 TO 2 PUFFS BY MOUTH EVERY 6 HOURS AS NEEDED FOR WHEEZING, Disp: 7 each, Rfl: 0   fexofenadine (ALLEGRA) 180 MG tablet, Take 1 tablet (180 mg total) by mouth daily., Disp: 90 tablet, Rfl: 3   fluocinonide-emollient (LIDEX-E) 0.05 % cream, Apply 1 application topically 2 (two) times daily., Disp: 60 g, Rfl: 0   fluticasone-salmeterol (ADVAIR HFA) 115-21 MCG/ACT inhaler, Inhale 2 puffs into the lungs 2 (two) times daily., Disp: 1 Inhaler, Rfl: 12   ibuprofen (ADVIL) 800 MG tablet, Take 1 tablet (800 mg total) by mouth 3 (three) times daily., Disp: 21 tablet, Rfl: 0   Medications ordered in this encounter:  Meds ordered this encounter  Medications   amoxicillin-clavulanate (AUGMENTIN) 875-125 MG tablet    Sig: Take 1 tablet by mouth 2 (two) times daily.    Dispense:  20 tablet    Refill:  0    Order Specific Question:   Supervising Provider    Answer:   Hyacinth Meeker, BRIAN [[id number]]     *If you need refills on other medications prior to your next appointment, please contact your pharmacy*  Follow-Up: Call back or seek an in-person evaluation if the symptoms worsen or if the condition fails to improve as anticipated.  Other Instructions  Preseptal Cellulitis, Adult Preseptal cellulitis is an infection of the eyelid and the  tissues around the eye (periorbital area). The infection causes painful swelling and redness. This condition may also be called periorbital cellulitis. In most cases, the condition can be treated with antibiotic medicine at home. It is important to treat preseptal cellulitis right away so that it does not get worse. If it gets worse, it can spread to the eye socket and eye muscles (orbital cellulitis). Orbital cellulitis is a medical emergency. What are the causes? Preseptal cellulitis is most commonly caused by bacteria. In rare cases, it can be caused by a virus or fungus. The germs that cause preseptal cellulitis may come from: A sinus infection that spreads near the eyes. An injury near the eye, such as a scratch, puncture wound, animal bite, or insect bite. A skin rash, such as eczema or poison ivy, that becomes infected. An infected pimple on the eyelid (stye). Infection after eyelid surgery or injury. What increases the risk? You are more likely to develop this condition if: You have a weakened disease-fighting system (immune system). You have a medical condition that raises your risk for sinus infections, such as nasal polyps. What are the signs or symptoms? Symptoms of this condition include: Eyelids that are red and swollen and feel unusually hot. Fever. Difficulty opening the eye. Headache. Pain in the face. Symptoms of this condition usually develop suddenly. How is this diagnosed? This condition may be diagnosed based on your symptoms, your medical history, and an eye exam. You may also have tests, such as: Blood tests. Tests (cultures) to find  out which specific bacteria are causing the infection. You may have a culture of any open wound or drainage. CT scan. MRI. This is less common. How is this treated? This condition is treated with antibiotic medicines. These may be given by mouth (orally), through an IV, or as an injection. In rare cases, you may need surgery to drain  an infected area. Follow these instructions at home: Medicines Take your antibiotic medicine as told by your health care provider. Do not stop taking the antibiotic even if you start to feel better Take over-the-counter and prescription medicines only as told by your health care provider. Eye Care Do not use eye drops without first getting approval from your health care provider. Do not touch or rub your eye. If you wear contact lenses, do not wear them until your health care provider approves. Keep the eye area clean and dry. Wash the eye area with a clean washcloth, warm water, and baby shampoo or mild soap. To help relieve discomfort, place a clean washcloth that is wet with warm water over your eye. Leave the washcloth on for a few minutes, then remove it. General instructions Wash your hands with soap and water often for at least 20 seconds. If soap and water are not available, use hand sanitizer. Do not use any products that contain nicotine or tobacco, such as cigarettes, e-cigarettes, and chewing tobacco. If you need help quitting, ask your health care provider. Drink enough fluid to keep your urine pale yellow. Do not drive or operate machinery until your health care provider says that it is safe. Ask your health care provider if it is safe for you to drive. Stay up to date on your vaccinations. Keep all follow-up visits. This includes any visits with an eye specialist (ophthalmologist) or dentist. This is important. Get help right away if: You have new symptoms. Your symptoms get worse or do not get better with treatment. You have a fever. Your vision becomes blurry or gets worse in any way. Your eye looks like it is sticking out or bulging out (proptosis). You develop double vision. You have trouble moving your eyes or pain when moving your eyes You have a severe headache. You have neck stiffness or severe neck pain. These symptoms may represent a serious problem that is an  emergency. Do not wait to see if the symptoms will go away. Get medical help right away. Call your local emergency services (911 in the [place] Do not drive yourself to the hospital. Summary Preseptal cellulitis is an infection of the eyelid and the tissues around the eye. Symptoms of preseptal cellulitis usually develop suddenly and include red and swollen eyelids, fever, difficulty opening the eye, headache, and facial pain. This condition is treated with antibiotic medicines. Do not stop taking the antibiotic even if you start to feel better. Preseptal cellulitis can develop into orbital cellulitis, which is a medical emergency. If your condition does not improve or worsens, visit your heath care provider right away. This information is not intended to replace advice given to you by your health care provider. Make sure you discuss any questions you have with your health care provider. Document Revised: 06/08/2019 Document Reviewed: 06/08/2019 Elsevier Patient Education  2023 Elsevier Inc.    If you have been instructed to have an in-person evaluation today at a local Urgent Care facility, please use the link below. It will take you to a list of all of our available [facility], including address, phone  number and hours of operation. Please do not delay care.  [facility]  If you or a family member do not have a primary care provider, use the link below to schedule a visit and establish care. When you choose a [facility] primary care physician or advanced practice provider, you gain a long-term partner in health. Find a Primary Care Provider  Learn more about [facility]'s in-office and virtual care options: [facility]

## 2021-10-29 ENCOUNTER — Other Ambulatory Visit: Payer: Self-pay | Admitting: Family Medicine

## 2021-10-29 ENCOUNTER — Telehealth: Payer: Self-pay | Admitting: Family Medicine

## 2021-10-29 ENCOUNTER — Emergency Department (HOSPITAL_BASED_OUTPATIENT_CLINIC_OR_DEPARTMENT_OTHER)
Admission: EM | Admit: 2021-10-29 | Discharge: 2021-10-29 | Disposition: A | Discharge status: Home / Self Care | Payer: BC Managed Care – PPO | Attending: Emergency Medicine | Admitting: Emergency Medicine

## 2021-10-29 ENCOUNTER — Encounter (HOSPITAL_BASED_OUTPATIENT_CLINIC_OR_DEPARTMENT_OTHER): Payer: Self-pay | Admitting: Emergency Medicine

## 2021-10-29 ENCOUNTER — Other Ambulatory Visit: Payer: Self-pay

## 2021-10-29 DIAGNOSIS — R062 Wheezing: Secondary | ICD-10-CM | POA: Diagnosis present

## 2021-10-29 DIAGNOSIS — Z7951 Long term (current) use of inhaled steroids: Secondary | ICD-10-CM | POA: Diagnosis not present

## 2021-10-29 DIAGNOSIS — J45901 Unspecified asthma with (acute) exacerbation: Secondary | ICD-10-CM | POA: Diagnosis not present

## 2021-10-29 DIAGNOSIS — Z91010 Allergy to peanuts: Secondary | ICD-10-CM | POA: Insufficient documentation

## 2021-10-29 MED ORDER — ALBUTEROL SULFATE HFA 108 (90 BASE) MCG/ACT IN AERS
2.0000 | INHALATION_SPRAY | RESPIRATORY_TRACT | Status: DC | PRN
  Administered 2021-10-29: 2 via RESPIRATORY_TRACT

## 2021-10-29 MED ORDER — ALBUTEROL SULFATE HFA 108 (90 BASE) MCG/ACT IN AERS
INHALATION_SPRAY | RESPIRATORY_TRACT | Status: AC
  Filled 2021-10-29: qty 6.7

## 2021-10-29 MED ORDER — ALBUTEROL SULFATE (2.5 MG/3ML) 0.083% IN NEBU
5.0000 mg | INHALATION_SOLUTION | Freq: Once | RESPIRATORY_TRACT | Status: AC
  Administered 2021-10-29: 5 mg via RESPIRATORY_TRACT
  Filled 2021-10-29: qty 6

## 2021-10-29 MED ORDER — IPRATROPIUM BROMIDE 0.02 % IN SOLN
0.5000 mg | Freq: Once | RESPIRATORY_TRACT | Status: AC
  Administered 2021-10-29: 0.5 mg via RESPIRATORY_TRACT
  Filled 2021-10-29: qty 2.5

## 2021-10-29 NOTE — ED Provider Notes (Signed)
[facility] EMERGENCY DEPT Provider Note   CSN: [id number] Arrival date & time: 10/29/21  1740     History  Chief Complaint  Patient presents with   Asthma    Connie Payne is a 31 y.o. female.  Patient presents to the emergency department for evaluation of asthma exacerbation.  Patient states that she works at [employer] in a hot environment.  While working today she had increasing wheezing and shortness of breath.  She noted that her albuterol inhaler ran out.  She tried to contact the pharmacy but did not have any refills.  She tried to contact PCP but was unable to get an inhaler called in.  She presents for evaluation.  Denies fever or URI symptoms.  No chest pain.  No lower extremity swelling.  Symptoms consistent with her asthma.  No definite triggers today, but states that triggers are typically environmental.       Home Medications Prior to Admission medications   Medication Sig Start Date End Date Taking? Authorizing Provider  albuterol (VENTOLIN HFA) 108 (90 Base) MCG/ACT inhaler INHALE 1 TO 2 PUFFS BY MOUTH EVERY 6 HOURS AS NEEDED FOR WHEEZING 08/27/21   Deeann Saint, MD  amoxicillin-clavulanate (AUGMENTIN) 875-125 MG tablet Take 1 tablet by mouth 2 (two) times daily. 09/27/21   Margaretann Loveless, PA-C  fexofenadine (ALLEGRA) 180 MG tablet Take 1 tablet (180 mg total) by mouth daily. 10/07/18   Deeann Saint, MD  fluocinonide-emollient (LIDEX-E) 0.05 % cream Apply 1 application topically 2 (two) times daily. 11/01/20   Deeann Saint, MD  fluticasone-salmeterol (ADVAIR HFA) 573-22 MCG/ACT inhaler Inhale 2 puffs into the lungs 2 (two) times daily. 10/07/18   Deeann Saint, MD  ibuprofen (ADVIL) 800 MG tablet Take 1 tablet (800 mg total) by mouth 3 (three) times daily. 07/03/19   Roxy Horseman, PA-C      Allergies    Peanuts [peanut oil] and Sulfa antibiotics    Review of Systems   Review of Systems  Physical Exam Updated Vital  Signs BP (!) 130/93   Pulse 75   Temp 98.1 F (36.7 C) (Oral)   Resp 18   LMP 09/30/2021 (Approximate)   SpO2 99%  Physical Exam Vitals and nursing note reviewed.  Constitutional:      General: She is not in acute distress.    Appearance: She is well-developed.  HENT:     Head: Normocephalic and atraumatic.     Right Ear: External ear normal.     Left Ear: External ear normal.     Nose: Nose normal.  Eyes:     Conjunctiva/sclera: Conjunctivae normal.  Cardiovascular:     Rate and Rhythm: Normal rate and regular rhythm.     Heart sounds: No murmur heard. Pulmonary:     Effort: No respiratory distress.     Breath sounds: Wheezing present. No rhonchi or rales.     Comments: Minimal expiratory wheezing, scattered Abdominal:     Palpations: Abdomen is soft.     Tenderness: There is no abdominal tenderness. There is no guarding or rebound.  Musculoskeletal:     Cervical back: Normal range of motion and neck supple.     Right lower leg: No edema.     Left lower leg: No edema.  Skin:    General: Skin is warm and dry.     Findings: No rash.  Neurological:     General: No focal deficit present.     Mental Status:  She is alert. Mental status is at baseline.     Motor: No weakness.  Psychiatric:        Mood and Affect: Mood normal.     ED Results / Procedures / Treatments   Labs (all labs ordered are listed, but only abnormal results are displayed) Labs Reviewed - No data to display  EKG None  Radiology No results found.  Procedures Procedures    Medications Ordered in ED Medications  albuterol (VENTOLIN HFA) 108 (90 Base) MCG/ACT inhaler (  Not Given 10/29/21 1801)  albuterol (VENTOLIN HFA) 108 (90 Base) MCG/ACT inhaler 2 puff (2 puffs Inhalation Given 10/29/21 1757)  albuterol (PROVENTIL) (2.5 MG/3ML) 0.083% nebulizer solution 5 mg (5 mg Nebulization Given 10/29/21 2111)  ipratropium (ATROVENT) nebulizer solution 0.5 mg (0.5 mg Nebulization Given 10/29/21 2115)     ED Course/ Medical Decision Making/ A&P    Patient seen and examined. History obtained directly from patient.  She is doing well except reports mild chest tightness and wheezing.  Improved after treatment with albuterol inhaler here.  Offered albuterol treatment with nebulizer and she accepts.  Plan for discharge after treatment complete.  Labs/EKG: None ordered  Imaging: None ordered  Medications/Fluids: Ordered: Albuterol and Atrovent nebulizer  Most recent vital signs reviewed and are as follows: BP (!) 130/93   Pulse 75   Temp 98.1 F (36.7 C) (Oral)   Resp 18   LMP 09/30/2021 (Approximate)   SpO2 99%   Initial impression: Asthma exacerbation  Home treatment plan: 2 puffs from albuterol HFA every 4 hours as needed.  Return instructions discussed with patient: Return with worsening shortness of breath or trouble breathing, new symptoms or other concerns  Follow-up instructions discussed with patient: Follow-up with PCP.                          Medical Decision Making Risk Prescription drug management.   Well-appearing patient with asthma exacerbation, improved with albuterol in the emergency department.  No infectious symptoms.  No chest pain or vital signs abnormality which would be concerning for asthma exacerbation.  She ran out of her albuterol inhaler today.  The patient's vital signs, pertinent lab work and imaging were reviewed and interpreted as discussed in the ED course. Hospitalization was considered for further testing, treatments, or serial exams/observation. However as patient is well-appearing, has a stable exam, and reassuring studies today, I do not feel that they warrant admission at this time. This plan was discussed with the patient who verbalizes agreement and comfort with this plan and seems reliable and able to return to the Emergency Department with worsening or changing symptoms.          Final Clinical Impression(s) / ED  Diagnoses Final diagnoses:  Asthma with acute exacerbation, unspecified asthma severity, unspecified whether persistent    Rx / DC Orders ED Discharge Orders     None         Desmond Dike 10/29/21 2213    Rondel Baton, MD 10/30/21 1229

## 2021-10-29 NOTE — Telephone Encounter (Signed)
Pt called to request a refill of the following:  albuterol (VENTOLIN HFA) 108 (90 Base) MCG/ACT inhaler  LOV:   11/01/2020 = CPE  Next OV:  11/01/2021  Please advise.  [facility] [address], [place] - [address] Phone:  [phone]  Fax:  [phone]

## 2021-10-29 NOTE — ED Triage Notes (Signed)
Ran out of inhaler.  Having asthma exacerbation symptoms today.  RT seeing in triage.  Wheezing on exam. Improvement after albuterol.

## 2021-10-29 NOTE — Discharge Instructions (Signed)
Please read and follow all provided instructions.  Your diagnoses today include:  1. Asthma with acute exacerbation, unspecified asthma severity, unspecified whether persistent    Tests performed today include: Vital signs. See below for your results today.   Medications prescribed:  Albuterol inhaler - medication that opens up your airway  Use inhaler as follows: 1-2 puffs with spacer every 4 hours as needed for wheezing, cough, or shortness of breath.   Take any prescribed medications only as directed.  Home care instructions:  Follow any educational materials contained in this packet.  Follow-up instructions: Please follow-up with your primary care provider in the next 3 days for further evaluation of your symptoms and management of your asthma.  Return instructions:  Please return to the Emergency Department if you experience worsening symptoms. Please return with worsening wheezing, shortness of breath, or difficulty breathing. Return with persistent fever above 101F.  Please return if you have any other emergent concerns.  Additional Information:  Your vital signs today were: BP (!) 130/93   Pulse 75   Temp 98.1 F (36.7 C) (Oral)   Resp 18   LMP 09/30/2021 (Approximate)   SpO2 99%  If your blood pressure (BP) was elevated above 135/85 this visit, please have this repeated by your doctor within one month. --------------

## 2021-10-30 NOTE — Telephone Encounter (Signed)
Refill already sent to pharmacy.

## 2021-11-01 ENCOUNTER — Ambulatory Visit: Payer: BC Managed Care – PPO | Admitting: Family Medicine

## 2021-11-08 ENCOUNTER — Ambulatory Visit: Payer: BC Managed Care – PPO | Admitting: Family Medicine

## 2021-11-08 VITALS — BP 122/86 | HR 87 | Temp 98.1°F | Wt 209.6 lb

## 2021-11-08 DIAGNOSIS — Z9109 Other allergy status, other than to drugs and biological substances: Secondary | ICD-10-CM

## 2021-11-08 DIAGNOSIS — L309 Dermatitis, unspecified: Secondary | ICD-10-CM | POA: Diagnosis not present

## 2021-11-08 DIAGNOSIS — R454 Irritability and anger: Secondary | ICD-10-CM | POA: Diagnosis not present

## 2021-11-08 MED ORDER — FLUOCINONIDE EMULSIFIED BASE 0.05 % EX CREA: 1.0000 | TOPICAL_CREAM | Freq: Two times a day (BID) | CUTANEOUS | 1 refills | Status: DC

## 2021-11-08 NOTE — Progress Notes (Signed)
Subjective:    Patient ID: Connie Payne, female    DOB: May 19, 1990, 31 y.o.   MRN: [id number]  Chief Complaint  Patient presents with   Medication Refill    HPI Patient is a 31 year old female with PMH SIG for seasonal allergies, asthma, eczema who was seen today for f/u and med refill.  Pt states she has been doing well for the most part.  Endorses increased issues at work at [employer].  Pt has been irritable, getting angry more.  Endorses frustration, tries to stay to herself.  Pt has thought about counseling, but was not sure about taking further steps.  States as a child was in therapy for anger management.    Pt requesting a refill on cream for eczema.  Pt also notes rash on abd after belt buckle contacted skin.  Past Medical History:  Diagnosis Date   Allergy    Asthma    Eczema     Allergies  Allergen Reactions   Peanuts [Peanut Oil] Anaphylaxis, Hives and Itching   Sulfa Antibiotics Nausea And Vomiting    ROS General: Denies fever, chills, night sweats, changes in weight, changes in appetite HEENT: Denies headaches, ear pain, changes in vision, rhinorrhea, sore throat CV: Denies CP, palpitations, SOB, orthopnea Pulm: Denies SOB, cough, wheezing GI: Denies abdominal pain, nausea, vomiting, diarrhea, constipation GU: Denies dysuria, hematuria, frequency, vaginal discharge Msk: Denies muscle cramps, joint pains Neuro: Denies weakness, numbness, tingling Skin: Denies rashes, bruising +eczema, rash on abd Psych: Denies depression, anxiety, hallucinations  +irritable    Objective:    Blood pressure 122/86, pulse 87, temperature 98.1 F (36.7 C), temperature source Oral, weight 209 lb 9.6 oz (95.1 kg), last menstrual period 09/30/2021, SpO2 96 %.  Gen. Pleasant, well-nourished, in no distress, normal affect   HEENT: [place]/AT, face symmetric, conjunctiva clear, no scleral icterus, PERRLA, EOMI, nares patent without drainage Lungs: no accessory muscle  use Cardiovascular: RRR, no peripheral edema Neuro:  A&Ox3, CN II-XII intact, normal gait Skin:  Warm, dry, intact.  Hyperpigmented, hypertrophic rash on lower abd.  Eczematous rash on neck   Wt Readings from Last 3 Encounters:  11/08/21 209 lb 9.6 oz (95.1 kg)  11/01/20 214 lb 9.6 oz (97.3 kg)  02/25/20 230 lb (104.3 kg)    Lab Results  Component Value Date   WBC 4.9 11/01/2020   HGB 12.2 11/01/2020   HCT 37.0 11/01/2020   PLT 312.0 11/01/2020   GLUCOSE 84 11/01/2020   CHOL 137 11/01/2020   TRIG 36.0 11/01/2020   HDL 43.60 11/01/2020   LDLCALC 86 11/01/2020   ALT 12 11/01/2020   AST 29 11/01/2020   NA 138 11/01/2020   K 3.7 11/01/2020   CL 106 11/01/2020   CREATININE 0.64 11/01/2020   BUN 7 11/01/2020   CO2 25 11/01/2020   TSH 1.86 11/01/2020   HGBA1C 5.2 11/01/2020      11/08/2021   11:11 AM 11/01/2020    1:56 PM  Depression screen PHQ 2/9  Decreased Interest 1 2  Down, Depressed, Hopeless 0 1  PHQ - 2 Score 1 3  Altered sleeping 3 3  Tired, decreased energy 1 1  Change in appetite 3 1  Feeling bad or failure about yourself  0 1  Trouble concentrating 1 1  Moving slowly or fidgety/restless 0 0  Suicidal thoughts 0 0  PHQ-9 Score 9 10  Difficult doing work/chores Somewhat difficult Somewhat difficult   Assessment/Plan:  Eczema, unspecified type -Discussed supportive care and  ways to reduce eczema symptoms -Given handout - Plan: fluocinonide-emollient (LIDEX-E) 0.05 % cream  Irritable mood -Worsening problem -PHQ-9 score 9 this visit -Patient encouraged to consider counseling -Given information on [facility] providers -Given handouts -Discussed ways to de-escalate situations -Continue to monitor  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

## 2021-11-08 NOTE — Patient Instructions (Signed)
Behavioral Health Services: -to make an appointment contact the office/provider you are interested in seeing.  No referral is needed.  The below is not an all inclusive list, but will help you get started.  [email or web address] -counseling located off of [address]  [email or web address] -website helps you find providers in your area  [facility] -Located off of [address] across from [employer]  Dr. Akintayo is a Psychiatrist with . [phone]  [facility]  [facility]  -[address]  [phone] -a place in town that has counseling and Psychiatry services.    

## 2021-11-29 ENCOUNTER — Encounter: Payer: Self-pay | Admitting: Family Medicine

## 2021-12-06 ENCOUNTER — Encounter: Payer: BC Managed Care – PPO | Admitting: Family Medicine

## 2021-12-30 ENCOUNTER — Other Ambulatory Visit: Payer: Self-pay | Admitting: Family Medicine

## 2021-12-30 NOTE — Telephone Encounter (Signed)
Patient called to follow up on refill for albuterol (VENTOLIN HFA) 108 (90 Base) MCG/ACT inhaler    Patient is out of previous inhaler and is sick, so patient is needing it. Patient was made aware of the 72 hour refill and new prescription policy.    Please advise

## 2022-01-20 ENCOUNTER — Other Ambulatory Visit: Payer: Self-pay | Admitting: Family Medicine

## 2022-01-20 MED ORDER — ALBUTEROL SULFATE HFA 108 (90 BASE) MCG/ACT IN AERS: 1.0000 | INHALATION_SPRAY | Freq: Four times a day (QID) | RESPIRATORY_TRACT | 0 refills | Status: DC | PRN

## 2022-02-25 ENCOUNTER — Encounter (HOSPITAL_BASED_OUTPATIENT_CLINIC_OR_DEPARTMENT_OTHER): Payer: Self-pay

## 2022-02-25 ENCOUNTER — Other Ambulatory Visit: Payer: Self-pay

## 2022-02-25 ENCOUNTER — Emergency Department (HOSPITAL_BASED_OUTPATIENT_CLINIC_OR_DEPARTMENT_OTHER)
Admission: EM | Admit: 2022-02-25 | Discharge: 2022-02-25 | Disposition: A | Discharge status: Home / Self Care | Payer: BC Managed Care – PPO | Attending: Emergency Medicine | Admitting: Emergency Medicine

## 2022-02-25 DIAGNOSIS — N939 Abnormal uterine and vaginal bleeding, unspecified: Secondary | ICD-10-CM | POA: Diagnosis not present

## 2022-02-25 DIAGNOSIS — J45909 Unspecified asthma, uncomplicated: Secondary | ICD-10-CM | POA: Insufficient documentation

## 2022-02-25 DIAGNOSIS — Z7951 Long term (current) use of inhaled steroids: Secondary | ICD-10-CM | POA: Insufficient documentation

## 2022-02-25 DIAGNOSIS — Z91010 Allergy to peanuts: Secondary | ICD-10-CM | POA: Diagnosis not present

## 2022-02-25 LAB — CBC WITH DIFFERENTIAL/PLATELET
Abs Immature Granulocytes: 0.01 10*3/uL (ref 0.00–0.07)
Basophils Absolute: 0 10*3/uL (ref 0.0–0.1)
Basophils Relative: 1 %
Eosinophils Absolute: 0.3 10*3/uL (ref 0.0–0.5)
Eosinophils Relative: 5 %
HCT: 33 % — ABNORMAL LOW (ref 36.0–46.0)
Hemoglobin: 10.9 g/dL — ABNORMAL LOW (ref 12.0–15.0)
Immature Granulocytes: 0 %
Lymphocytes Relative: 41 %
Lymphs Abs: 2.9 10*3/uL (ref 0.7–4.0)
MCH: 27.9 pg (ref 26.0–34.0)
MCHC: 33 g/dL (ref 30.0–36.0)
MCV: 84.4 fL (ref 80.0–100.0)
Monocytes Absolute: 0.7 10*3/uL (ref 0.1–1.0)
Monocytes Relative: 10 %
Neutro Abs: 3.1 10*3/uL (ref 1.7–7.7)
Neutrophils Relative %: 43 %
Platelets: 310 10*3/uL (ref 150–400)
RBC: 3.91 MIL/uL (ref 3.87–5.11)
RDW: 13.8 % (ref 11.5–15.5)
WBC: 7.1 10*3/uL (ref 4.0–10.5)
nRBC: 0 % (ref 0.0–0.2)

## 2022-02-25 LAB — URINALYSIS, ROUTINE W REFLEX MICROSCOPIC
Bacteria, UA: NONE SEEN
Bilirubin Urine: NEGATIVE
Glucose, UA: NEGATIVE mg/dL
Ketones, ur: NEGATIVE mg/dL
Nitrite: NEGATIVE
Protein, ur: 30 mg/dL — AB
RBC / HPF: >50 RBC/hpf — ABNORMAL HIGH (ref 0–5)
Specific Gravity, Urine: 1.039 — ABNORMAL HIGH (ref 1.005–1.030)
pH: 6.5 (ref 5.0–8.0)

## 2022-02-25 LAB — BASIC METABOLIC PANEL
Anion gap: 6 (ref 5–15)
BUN: 14 mg/dL (ref 6–20)
CO2: 24 mmol/L (ref 22–32)
Calcium: 9.1 mg/dL (ref 8.9–10.3)
Chloride: 106 mmol/L (ref 98–111)
Creatinine, Ser: 0.59 mg/dL (ref 0.44–1.00)
GFR, Estimated: >60 mL/min (ref >60)
Glucose, Bld: 92 mg/dL (ref 70–99)
Potassium: 3.6 mmol/L (ref 3.5–5.1)
Sodium: 136 mmol/L (ref 135–145)

## 2022-02-25 LAB — PREGNANCY, URINE: Preg Test, Ur: NEGATIVE

## 2022-02-25 LAB — ABO/RH: ABO/RH(D): AB POS

## 2022-02-25 LAB — HCG, QUANTITATIVE, PREGNANCY: hCG, Beta Chain, Quant, S: 7 m[IU]/mL — ABNORMAL HIGH (ref <5)

## 2022-02-25 NOTE — ED Triage Notes (Signed)
Patient presents from home, states she found out she was pregnant through an at home test 4 days ago. States her last period was 01/25/2022. States this is her first pregnancy. CC today is bleeding, states it is more than spotting but not a normal period for her as her flows are quite heavy. Patient aaxo4, ambulatory.

## 2022-02-25 NOTE — ED Notes (Signed)
Educated patient about purpose of a HCG Quant, states she does not want blood drawn and to return to the waiting room. States she wants to wait until she is placed in a room and evaluated prior to blood draw.

## 2022-02-25 NOTE — ED Notes (Signed)
Last note charted in error.  

## 2022-02-25 NOTE — Discharge Instructions (Signed)
Follow-up with your primary doctor or OB/GYN in 3 days to have repeat quantitative hCG testing.  Return to the emergency department if you develop abdominal pain, severe bleeding, or for other new and concerning symptoms.

## 2022-02-25 NOTE — ED Provider Notes (Signed)
[place] EMERGENCY DEPT Provider Note   CSN: [id number] Arrival date & time: 02/25/22  0036     History  Chief Complaint  Patient presents with   Vaginal Bleeding    Connie Payne is a 32 y.o. female.  Patient is a 32 year old female with past medical history of asthma.  She presents today for evaluation of vaginal bleeding.  Patient had a positive home pregnancy test 2 days ago, then began with bleeding consistent with her normal menses this morning.  She is not having any pain or discomfort.  She denies any fevers or chills.  Patient has never been pregnant before.  The history is provided by the patient.       Home Medications Prior to Admission medications   Medication Sig Start Date End Date Taking? Authorizing Provider  albuterol (VENTOLIN HFA) 108 (90 Base) MCG/ACT inhaler Inhale 1-2 puffs into the lungs every 6 (six) hours as needed for wheezing or shortness of breath. 01/20/22   Billie Ruddy, MD  amoxicillin-clavulanate (AUGMENTIN) 875-125 MG tablet Take 1 tablet by mouth 2 (two) times daily. Patient not taking: Reported on 11/08/2021 09/27/21   Mar Daring, PA-C  fexofenadine (ALLEGRA) 180 MG tablet Take 1 tablet (180 mg total) by mouth daily. 10/07/18   Billie Ruddy, MD  fluocinonide-emollient (LIDEX-E) 0.05 % cream Apply 1 Application topically 2 (two) times daily. 11/08/21   Billie Ruddy, MD  fluticasone-salmeterol (ADVAIR HFA) 409-81 MCG/ACT inhaler Inhale 2 puffs into the lungs 2 (two) times daily. 10/07/18   Billie Ruddy, MD  ibuprofen (ADVIL) 800 MG tablet Take 1 tablet (800 mg total) by mouth 3 (three) times daily. 07/03/19   Montine Circle, PA-C      Allergies    Peanuts [peanut oil] and Sulfa antibiotics    Review of Systems   Review of Systems  All other systems reviewed and are negative.   Physical Exam Updated Vital Signs BP (!) 140/83 (BP Location: Right Arm)   Pulse (!) 102   Temp 98 F (36.7  C) (Oral)   Resp 18   Ht 5\' 7"  (1.702 m)   Wt 94.8 kg   LMP 09/30/2021 (Approximate)   SpO2 100%   BMI 32.73 kg/m  Physical Exam Vitals and nursing note reviewed.  Constitutional:      General: She is not in acute distress.    Appearance: She is well-developed. She is not diaphoretic.  HENT:     Head: Normocephalic and atraumatic.  Cardiovascular:     Rate and Rhythm: Normal rate and regular rhythm.     Heart sounds: No murmur heard.    No friction rub. No gallop.  Pulmonary:     Effort: Pulmonary effort is normal. No respiratory distress.     Breath sounds: Normal breath sounds. No wheezing.  Abdominal:     General: Bowel sounds are normal. There is no distension.     Palpations: Abdomen is soft.     Tenderness: There is no abdominal tenderness.  Musculoskeletal:        General: Normal range of motion.     Cervical back: Normal range of motion and neck supple.  Skin:    General: Skin is warm and dry.  Neurological:     General: No focal deficit present.     Mental Status: She is alert and oriented to person, place, and time.     ED Results / Procedures / Treatments   Labs (all labs ordered are  listed, but only abnormal results are displayed) Labs Reviewed  URINALYSIS, ROUTINE W REFLEX MICROSCOPIC - Abnormal; Notable for the following components:      Result Value   Specific Gravity, Urine 1.039 (*)    Hgb urine dipstick LARGE (*)    Protein, ur 30 (*)    Leukocytes,Ua TRACE (*)    RBC / HPF >50 (*)    All other components within normal limits  PREGNANCY, URINE  CBC WITH DIFFERENTIAL/PLATELET  BASIC METABOLIC PANEL  HCG, QUANTITATIVE, PREGNANCY  ABO/RH    EKG None  Radiology No results found.  Procedures Procedures    Medications Ordered in ED Medications - No data to display  ED Course/ Medical Decision Making/ A&P  Patient presents here with complaints of vaginal bleeding.  She had a home pregnancy test 2 days ago that was positive.  Patient's  urine pregnancy test today is negative and quantitative blood test obtained showed an hCG level of 7.  I am uncertain as to whether or not patient was pregnant and this number is trending down and she is in the process of a miscarriage or whether the patient has not been pregnant at all.  Either way, I do not feel as though an ultrasound will contribute any help due to the low hCG level.  I feel as though a repeat hCG level in the next few days would be most beneficial.  I will have the patient follow-up with her primary doctor for this.  Remainder of laboratory studies unremarkable including CBC and metabolic panel.  Blood type pending.  Final Clinical Impression(s) / ED Diagnoses Final diagnoses:  None    Rx / DC Orders ED Discharge Orders     None         Geoffery Lyons, MD 02/25/22 [phone]

## 2022-02-25 NOTE — ED Notes (Signed)
Pt verbalizes understanding of discharge instructions. Opportunity for questions and answers were provided. Pt discharged from the ED.   ?

## 2022-02-28 ENCOUNTER — Encounter: Payer: Self-pay | Admitting: Family Medicine

## 2022-02-28 ENCOUNTER — Ambulatory Visit: Payer: BC Managed Care – PPO | Admitting: Family Medicine

## 2022-02-28 VITALS — BP 118/82 | HR 85 | Temp 98.3°F | Wt 214.8 lb

## 2022-02-28 DIAGNOSIS — N939 Abnormal uterine and vaginal bleeding, unspecified: Secondary | ICD-10-CM | POA: Diagnosis not present

## 2022-02-28 LAB — POCT URINE PREGNANCY: Preg Test, Ur: NEGATIVE

## 2022-02-28 NOTE — Progress Notes (Signed)
   Acute Office Visit  Subjective:     Patient ID: Connie Payne, female    DOB: 1990-12-07, 32 y.o.   MRN: [id number]  Chief Complaint  Patient presents with   Follow-up    Pt reports she is a wk pregnant and was bleeding on Tuesday. Went to ED. Would like to discuss to Dr. Volanda Napoleon on it and would like a referral to GYN.     HPI Patient is in today for acute issue.  Patient states LMP was the beginning of December.  Home urine hCG test positive on Saturday, 02/22/2022.  Patient seen in ED 02/25/22 for vaginal bleeding x1 day.  Bleeding started off as dark-colored spotting, then increased in volume for a few days.  In ED urine hCG negative, quantitative hCG 7.  Patient states she is confused as she was told by the provider she was likely not pregnant and miscarrying, then told she was [redacted] week pregnant by staff.?  Review of Systems  Constitutional:  Negative for chills, fever and malaise/fatigue.  Gastrointestinal:  Negative for abdominal pain and nausea.  Genitourinary:        Spotting, change in menses        Objective:    BP 118/82 (BP Location: Right Arm, Patient Position: Sitting, Cuff Size: Large)   Pulse 85   Temp 98.3 F (36.8 C) (Oral)   Wt 214 lb 12.8 oz (97.4 kg)   LMP 09/30/2021 (Approximate)   SpO2 98%   BMI 33.64 kg/m    Physical Exam Constitutional:      Appearance: Normal appearance.  HENT:     Head: Normocephalic and atraumatic.     Nose: Nose normal.  Eyes:     Extraocular Movements: Extraocular movements intact.     Conjunctiva/sclera: Conjunctivae normal.     Pupils: Pupils are equal, round, and reactive to light.  Cardiovascular:     Rate and Rhythm: Normal rate and regular rhythm.     Heart sounds: Normal heart sounds.  Pulmonary:     Effort: Pulmonary effort is normal.     Breath sounds: Normal breath sounds.  Neurological:     Mental Status: She is alert and oriented to person, place, and time.  Psychiatric:        Mood and Affect:  Mood is anxious.        Behavior: Behavior normal.     Comments: Tearful     No results found for any visits on 02/28/22.      Assessment & Plan:   Problem List Items Addressed This Visit   None Visit Diagnoses     Abnormal uterine bleeding (AUB)    -  Primary   Relevant Orders   POCT urine pregnancy (Completed)   TSH   CBC with Differential/Platelets   HCG, Quant, Pregnancy       No orders of the defined types were placed in this encounter. Labs from ED reviewed.  Blood type AB+, Rh+ miscarriage suspected.  Confirm with repeat hCG testing.  Urine hCG negative in clinic.  Further recommendations based on results.  Consider counseling and follow-up with OB/GYN.    Return if symptoms worsen or fail to improve.  Billie Ruddy, MD

## 2022-03-01 LAB — CBC WITH DIFFERENTIAL/PLATELET
Absolute Monocytes: 392 cells/uL (ref 200–950)
Basophils Absolute: 32 cells/uL (ref 0–200)
Basophils Relative: 0.7 %
Eosinophils Absolute: 324 cells/uL (ref 15–500)
Eosinophils Relative: 7.2 %
HCT: 33.6 % — ABNORMAL LOW (ref 35.0–45.0)
Hemoglobin: 10.9 g/dL — ABNORMAL LOW (ref 11.7–15.5)
Lymphs Abs: 1967 cells/uL (ref 850–3900)
MCH: 27.8 pg (ref 27.0–33.0)
MCHC: 32.4 g/dL (ref 32.0–36.0)
MCV: 85.7 fL (ref 80.0–100.0)
MPV: 9.9 fL (ref 7.5–12.5)
Monocytes Relative: 8.7 %
Neutro Abs: 1787 cells/uL (ref 1500–7800)
Neutrophils Relative %: 39.7 %
Platelets: 329 10*3/uL (ref 140–400)
RBC: 3.92 10*6/uL (ref 3.80–5.10)
RDW: 13.2 % (ref 11.0–15.0)
Total Lymphocyte: 43.7 %
WBC: 4.5 10*3/uL (ref 3.8–10.8)

## 2022-03-01 LAB — HCG, QUANTITATIVE, PREGNANCY: HCG, Total, QN: <5 m[IU]/mL

## 2022-03-01 LAB — TSH: TSH: 2.03 mIU/L

## 2022-03-03 ENCOUNTER — Other Ambulatory Visit: Payer: Self-pay | Admitting: Family Medicine

## 2022-03-03 MED ORDER — ALBUTEROL SULFATE HFA 108 (90 BASE) MCG/ACT IN AERS: 1.0000 | INHALATION_SPRAY | Freq: Four times a day (QID) | RESPIRATORY_TRACT | 0 refills | Status: DC | PRN

## 2022-04-03 ENCOUNTER — Other Ambulatory Visit: Payer: Self-pay | Admitting: Family Medicine

## 2022-04-03 MED ORDER — ALBUTEROL SULFATE HFA 108 (90 BASE) MCG/ACT IN AERS: 1.0000 | INHALATION_SPRAY | Freq: Four times a day (QID) | RESPIRATORY_TRACT | 0 refills | Status: DC | PRN

## 2022-04-30 ENCOUNTER — Other Ambulatory Visit: Payer: Self-pay | Admitting: Family Medicine

## 2022-04-30 MED ORDER — ALBUTEROL SULFATE HFA 108 (90 BASE) MCG/ACT IN AERS: 1.0000 | INHALATION_SPRAY | Freq: Four times a day (QID) | RESPIRATORY_TRACT | 0 refills | Status: DC | PRN

## 2022-06-09 ENCOUNTER — Other Ambulatory Visit: Payer: Self-pay | Admitting: Family Medicine

## 2022-06-09 MED ORDER — ALBUTEROL SULFATE HFA 108 (90 BASE) MCG/ACT IN AERS: 1.0000 | INHALATION_SPRAY | Freq: Four times a day (QID) | RESPIRATORY_TRACT | 0 refills | Status: DC | PRN

## 2022-07-12 ENCOUNTER — Other Ambulatory Visit: Payer: Self-pay | Admitting: Family Medicine

## 2022-07-15 ENCOUNTER — Other Ambulatory Visit: Payer: Self-pay | Admitting: Family Medicine

## 2022-07-16 MED ORDER — ALBUTEROL SULFATE HFA 108 (90 BASE) MCG/ACT IN AERS: 1.0000 | INHALATION_SPRAY | Freq: Four times a day (QID) | RESPIRATORY_TRACT | 1 refills | Status: DC | PRN

## 2022-11-19 ENCOUNTER — Other Ambulatory Visit (HOSPITAL_BASED_OUTPATIENT_CLINIC_OR_DEPARTMENT_OTHER): Payer: Self-pay

## 2022-11-19 ENCOUNTER — Other Ambulatory Visit: Payer: Self-pay

## 2022-11-19 ENCOUNTER — Encounter (HOSPITAL_BASED_OUTPATIENT_CLINIC_OR_DEPARTMENT_OTHER): Payer: Self-pay | Admitting: Emergency Medicine

## 2022-11-19 ENCOUNTER — Emergency Department (HOSPITAL_BASED_OUTPATIENT_CLINIC_OR_DEPARTMENT_OTHER)
Admission: EM | Admit: 2022-11-19 | Discharge: 2022-11-19 | Disposition: A | Discharge status: Home / Self Care | Payer: 59 | Attending: Emergency Medicine | Admitting: Emergency Medicine

## 2022-11-19 DIAGNOSIS — Z7951 Long term (current) use of inhaled steroids: Secondary | ICD-10-CM | POA: Insufficient documentation

## 2022-11-19 DIAGNOSIS — Z91010 Allergy to peanuts: Secondary | ICD-10-CM | POA: Insufficient documentation

## 2022-11-19 DIAGNOSIS — J4521 Mild intermittent asthma with (acute) exacerbation: Secondary | ICD-10-CM | POA: Diagnosis not present

## 2022-11-19 DIAGNOSIS — Z7952 Long term (current) use of systemic steroids: Secondary | ICD-10-CM | POA: Insufficient documentation

## 2022-11-19 DIAGNOSIS — R0602 Shortness of breath: Secondary | ICD-10-CM | POA: Diagnosis present

## 2022-11-19 DIAGNOSIS — L309 Dermatitis, unspecified: Secondary | ICD-10-CM

## 2022-11-19 MED ORDER — IPRATROPIUM-ALBUTEROL 0.5-2.5 (3) MG/3ML IN SOLN: 3.0000 mL | Freq: Once | RESPIRATORY_TRACT | Status: DC

## 2022-11-19 MED ORDER — ALBUTEROL SULFATE (2.5 MG/3ML) 0.083% IN NEBU
2.5000 mg | INHALATION_SOLUTION | Freq: Once | RESPIRATORY_TRACT | Status: AC
  Administered 2022-11-19: 2.5 mg via RESPIRATORY_TRACT

## 2022-11-19 MED ORDER — PREDNISONE 20 MG PO TABS
40.0000 mg | ORAL_TABLET | Freq: Every day | ORAL | 0 refills | Status: AC
Start: 2022-11-19 — End: 2022-11-24
  Filled 2022-11-19: qty 10, 5d supply, fill #0

## 2022-11-19 MED ORDER — ALBUTEROL SULFATE HFA 108 (90 BASE) MCG/ACT IN AERS
2.0000 | INHALATION_SPRAY | RESPIRATORY_TRACT | Status: DC | PRN
  Administered 2022-11-19: 2 via RESPIRATORY_TRACT
  Filled 2022-11-19: qty 6.7

## 2022-11-19 MED ORDER — ALBUTEROL SULFATE (2.5 MG/3ML) 0.083% IN NEBU
2.5000 mg | INHALATION_SOLUTION | Freq: Once | RESPIRATORY_TRACT | Status: AC
  Administered 2022-11-19: 2.5 mg via RESPIRATORY_TRACT
  Filled 2022-11-19: qty 3

## 2022-11-19 MED ORDER — FLUOCINONIDE 0.05 % EX CREA
1.0000 | TOPICAL_CREAM | Freq: Two times a day (BID) | CUTANEOUS | 1 refills | Status: DC
  Filled 2022-11-19 (×2): qty 60, 30d supply, fill #0

## 2022-11-19 MED ORDER — FLUOCINONIDE 0.05 % EX CREA: TOPICAL_CREAM | Freq: Every day | CUTANEOUS | Status: AC

## 2022-11-19 NOTE — ED Triage Notes (Signed)
Pt via pov from home with asthma exacerbation since last night. Pt reports that she usually has an inhaler, but it has run out. Pt alert  oriented, nad noted.

## 2022-11-19 NOTE — ED Provider Notes (Signed)
[place] EMERGENCY DEPARTMENT AT [facility] Provider Note   CSN: [id number] Arrival date & time: 11/19/22  1009     History Asthma Chief Complaint  Patient presents with   Shortness of Breath    Connie Payne is a 32 y.o. female.  32 y.o female with a PMH of Asthma presents to the ED with a  chief complaint of shortness of breath which began last night. Patient arrives to be ED with minimal wheezing noted, she is also out of her inhaler and has not been able to take any medication for her symptoms. She is also having a productive cough with some clear sputum. No alleviating or exacerbating factors. No fever, no chills, no other complaints.   The history is provided by the patient.  Shortness of Breath Associated symptoms: cough and wheezing   Associated symptoms: no fever        Home Medications Prior to Admission medications   Medication Sig Start Date End Date Taking? Authorizing Provider  predniSONE (DELTASONE) 20 MG tablet Take 2 tablets (40 mg total) by mouth daily for 5 days. 11/19/22 11/24/22 Yes Juanette Urizar, Leonie Douglas, PA-C  albuterol (VENTOLIN HFA) 108 (90 Base) MCG/ACT inhaler Inhale 1-2 puffs into the lungs every 6 (six) hours as needed for wheezing or shortness of breath. 07/16/22   Deeann Saint, MD  fexofenadine (ALLEGRA) 180 MG tablet Take 1 tablet (180 mg total) by mouth daily. 10/07/18   Deeann Saint, MD  fluocinonide-emollient (LIDEX-E) 0.05 % cream Apply 1 Application topically 2 (two) times daily. 11/19/22   Claude Manges, PA-C  fluticasone-salmeterol (ADVAIR HFA) 782-95 MCG/ACT inhaler Inhale 2 puffs into the lungs 2 (two) times daily. 10/07/18   Deeann Saint, MD  ibuprofen (ADVIL) 800 MG tablet Take 1 tablet (800 mg total) by mouth 3 (three) times daily. 07/03/19   Roxy Horseman, PA-C      Allergies    Peanuts [peanut oil] and Sulfa antibiotics    Review of Systems   Review of Systems  Constitutional:  Negative for chills and  fever.  Respiratory:  Positive for cough, shortness of breath and wheezing.     Physical Exam Updated Vital Signs BP 131/86   Pulse 94   Temp 98.4 F (36.9 C) (Oral)   Resp 15   Ht 5\' 7"  (1.702 m)   Wt 97.4 kg   LMP 11/05/2022 (Approximate)   SpO2 97%   BMI 33.63 kg/m  Physical Exam Vitals and nursing note reviewed.  Constitutional:      Appearance: She is well-developed.  HENT:     Head: Normocephalic and atraumatic.     Mouth/Throat:     Mouth: Mucous membranes are moist.     Comments: Oropharynx is clear, no erythema, no PTA.  Cardiovascular:     Rate and Rhythm: Normal rate.  Pulmonary:     Effort: Pulmonary effort is normal. No tachypnea or accessory muscle usage.     Breath sounds: Examination of the right-upper field reveals wheezing. Examination of the left-upper field reveals wheezing. Examination of the right-lower field reveals wheezing. Examination of the left-lower field reveals wheezing. Wheezing present.     Comments: Minimal wheezing to the apex, and bases.  No rhonchi. Chest:     Chest wall: No tenderness.  Musculoskeletal:     Cervical back: Normal range of motion and neck supple.  Neurological:     Mental Status: She is alert.     ED Results / Procedures / Treatments  Labs (all labs ordered are listed, but only abnormal results are displayed) Labs Reviewed - No data to display  EKG None  Radiology No results found.  Procedures Procedures    Medications Ordered in ED Medications  albuterol (VENTOLIN HFA) 108 (90 Base) MCG/ACT inhaler 2 puff (2 puffs Inhalation Given 11/19/22 1032)  albuterol (PROVENTIL) (2.5 MG/3ML) 0.083% nebulizer solution 2.5 mg (2.5 mg Nebulization Given 11/19/22 1032)  albuterol (PROVENTIL) (2.5 MG/3ML) 0.083% nebulizer solution 2.5 mg (2.5 mg Nebulization Given 11/19/22 1031)    ED Course/ Medical Decision Making/ A&P                                 Medical Decision Making Risk Prescription drug  management.    Patient presents to the ED with a chief complaint of shortness of breath which began last night, underlying history of asthma, she does not have her inhaler and has not tried any methods at home.  She arrived to the ED with minimal wheezing auscultated to bilateral lung bases.  She is satting at 99% on room air without any signs of respiratory distress, no tachycardia, no tachypnea and afebrile.  She is also had a clear cough for a couple of weeks but denies any sick exposures or other complaints.   Breathing treatment was provided while in the ER.   11:32 AM reassessment of her breathing with improvement, diminished wheezing at this time.  No signs of respiratory distress, satting at 99% on room air.  Given a short prescription of steroids to help with asthma exacerbation.  Will also provided her with an inhaler while in the emergency department.  Requesting a refill on the cream that she uses for her eczema, I have refilled this for patient per her request.  She is hemodynamically stable for discharge.   Portions of this note were generated with Scientist, clinical (histocompatibility and immunogenetics). Dictation errors may occur despite best attempts at proofreading.   Final Clinical Impression(s) / ED Diagnoses Final diagnoses:  Mild intermittent asthma with exacerbation    Rx / DC Orders ED Discharge Orders          Ordered    predniSONE (DELTASONE) 20 MG tablet  Daily        11/19/22 1130    fluocinonide-emollient (LIDEX-E) 0.05 % cream  2 times daily        11/19/22 [address] PA-C 11/19/22 1132    Margarita Grizzle, MD 11/24/22 1246

## 2022-11-19 NOTE — Discharge Instructions (Addendum)
You were given a prescription for steroids on today's visit, please take 2 tablets daily for the next 5 days.   You were also given an inhaler to help with your asthma, please take this as prescribed.  In addition, I did fill your topical medication to help with your eczema.  Follow up with your primary care physician as needed.

## 2022-12-23 ENCOUNTER — Other Ambulatory Visit: Payer: Self-pay

## 2022-12-23 ENCOUNTER — Encounter (HOSPITAL_BASED_OUTPATIENT_CLINIC_OR_DEPARTMENT_OTHER): Payer: Self-pay | Admitting: Emergency Medicine

## 2022-12-23 ENCOUNTER — Emergency Department (HOSPITAL_BASED_OUTPATIENT_CLINIC_OR_DEPARTMENT_OTHER)
Admission: EM | Admit: 2022-12-23 | Discharge: 2022-12-23 | Disposition: A | Discharge status: Home / Self Care | Payer: 59 | Attending: Emergency Medicine | Admitting: Emergency Medicine

## 2022-12-23 ENCOUNTER — Other Ambulatory Visit (HOSPITAL_BASED_OUTPATIENT_CLINIC_OR_DEPARTMENT_OTHER): Payer: Self-pay

## 2022-12-23 DIAGNOSIS — J45901 Unspecified asthma with (acute) exacerbation: Secondary | ICD-10-CM

## 2022-12-23 DIAGNOSIS — J4541 Moderate persistent asthma with (acute) exacerbation: Secondary | ICD-10-CM | POA: Insufficient documentation

## 2022-12-23 DIAGNOSIS — Z91010 Allergy to peanuts: Secondary | ICD-10-CM | POA: Insufficient documentation

## 2022-12-23 DIAGNOSIS — Z7951 Long term (current) use of inhaled steroids: Secondary | ICD-10-CM | POA: Insufficient documentation

## 2022-12-23 DIAGNOSIS — R0602 Shortness of breath: Secondary | ICD-10-CM | POA: Diagnosis present

## 2022-12-23 DIAGNOSIS — Z7952 Long term (current) use of systemic steroids: Secondary | ICD-10-CM | POA: Diagnosis not present

## 2022-12-23 DIAGNOSIS — Z789 Other specified health status: Secondary | ICD-10-CM

## 2022-12-23 MED ORDER — PREDNISONE 10 MG PO TABS
30.0000 mg | ORAL_TABLET | Freq: Every day | ORAL | 0 refills | Status: AC
  Filled 2022-12-23: qty 12, 4d supply, fill #0

## 2022-12-23 MED ORDER — IPRATROPIUM-ALBUTEROL 0.5-2.5 (3) MG/3ML IN SOLN
RESPIRATORY_TRACT | Status: AC
  Administered 2022-12-23: 3 mL via RESPIRATORY_TRACT
  Filled 2022-12-23: qty 3

## 2022-12-23 MED ORDER — ALBUTEROL SULFATE HFA 108 (90 BASE) MCG/ACT IN AERS
2.0000 | INHALATION_SPRAY | RESPIRATORY_TRACT | Status: DC | PRN
  Administered 2022-12-23: 2 via RESPIRATORY_TRACT
  Filled 2022-12-23: qty 6.7

## 2022-12-23 MED ORDER — IPRATROPIUM-ALBUTEROL 0.5-2.5 (3) MG/3ML IN SOLN: 3.0000 mL | Freq: Once | RESPIRATORY_TRACT | Status: AC

## 2022-12-23 MED ORDER — PREDNISONE 20 MG PO TABS
40.0000 mg | ORAL_TABLET | Freq: Once | ORAL | Status: AC
  Administered 2022-12-23: 40 mg via ORAL
  Filled 2022-12-23: qty 2

## 2022-12-23 MED ORDER — ALBUTEROL SULFATE (2.5 MG/3ML) 0.083% IN NEBU: 2.5000 mg | INHALATION_SOLUTION | Freq: Once | RESPIRATORY_TRACT | Status: AC

## 2022-12-23 MED ORDER — ALBUTEROL SULFATE (2.5 MG/3ML) 0.083% IN NEBU
INHALATION_SOLUTION | RESPIRATORY_TRACT | Status: AC
  Administered 2022-12-23: 2.5 mg via RESPIRATORY_TRACT
  Filled 2022-12-23: qty 3

## 2022-12-23 NOTE — ED Provider Notes (Signed)
[place] EMERGENCY DEPARTMENT AT [facility] Provider Note   CSN: [id number] Arrival date & time: 12/23/22  0715     History  Chief Complaint  Patient presents with   Shortness of Breath    Connie Payne is a 32 y.o. female.  HPI 32 year old female history of asthma presents today stating that she is wheezing and out of her inhaler.  She lost her inhaler last night.  She states she uses her inhaler about 1 time per day and does not have any other medications.  She feels like if she had had the inhaler she would not have needed to come to the ED.  She denies any runny nose, cough, sore throat, productive cough, fever, or chills.  She has not been ill in any way.  She denies any history of PE or DVT.  She has eczema but does not take any other medications besides the albuterol.     Home Medications Prior to Admission medications   Medication Sig Start Date End Date Taking? Authorizing Provider  predniSONE (DELTASONE) 10 MG tablet Take 3 tablets (30 mg total) by mouth daily for 4 doses. 12/23/22 12/27/22 Yes Margarita Grizzle, MD  albuterol (VENTOLIN HFA) 108 (90 Base) MCG/ACT inhaler Inhale 1-2 puffs into the lungs every 6 (six) hours as needed for wheezing or shortness of breath. 07/16/22   Deeann Saint, MD  fexofenadine (ALLEGRA) 180 MG tablet Take 1 tablet (180 mg total) by mouth daily. 10/07/18   Deeann Saint, MD  fluocinonide cream (LIDEX) 0.05 % Apply 1 Application topically 2 (two) times daily. 11/19/22   Claude Manges, PA-C  fluticasone-salmeterol (ADVAIR HFA) 578-46 MCG/ACT inhaler Inhale 2 puffs into the lungs 2 (two) times daily. 10/07/18   Deeann Saint, MD  ibuprofen (ADVIL) 800 MG tablet Take 1 tablet (800 mg total) by mouth 3 (three) times daily. 07/03/19   Roxy Horseman, PA-C      Allergies    Peanuts [peanut oil] and Sulfa antibiotics    Review of Systems   Review of Systems  Physical Exam Updated Vital Signs BP 115/89   Pulse 94    Temp 98.6 F (37 C) (Oral)   Resp 14   Ht 1.702 m (5\' 7" )   Wt 101.6 kg   LMP 12/02/2022 (Approximate)   SpO2 100%   Breastfeeding No   BMI 35.08 kg/m  Physical Exam Vitals and nursing note reviewed.  Constitutional:      General: She is not in acute distress.    Appearance: She is well-developed.  HENT:     Head: Normocephalic and atraumatic.     Right Ear: External ear normal.     Left Ear: External ear normal.     Nose: Nose normal.  Eyes:     Conjunctiva/sclera: Conjunctivae normal.     Pupils: Pupils are equal, round, and reactive to light.  Pulmonary:     Effort: Pulmonary effort is normal.     Breath sounds: Examination of the right-lower field reveals decreased breath sounds. Examination of the left-lower field reveals decreased breath sounds. Decreased breath sounds present.     Comments: Patient is on nebulizer on my evaluation. Musculoskeletal:        General: Normal range of motion.     Cervical back: Normal range of motion and neck supple.  Skin:    General: Skin is warm and dry.     Capillary Refill: Capillary refill takes less than 2 seconds.  Neurological:  Mental Status: She is alert and oriented to person, place, and time.     Motor: No abnormal muscle tone.     Coordination: Coordination normal.  Psychiatric:        Behavior: Behavior normal.        Thought Content: Thought content normal.     ED Results / Procedures / Treatments   Labs (all labs ordered are listed, but only abnormal results are displayed) Labs Reviewed - No data to display  EKG None  Radiology No results found.  Procedures Procedures    Medications Ordered in ED Medications  albuterol (VENTOLIN HFA) 108 (90 Base) MCG/ACT inhaler 2 puff (2 puffs Inhalation Given 12/23/22 0737)  ipratropium-albuterol (DUONEB) 0.5-2.5 (3) MG/3ML nebulizer solution 3 mL (3 mLs Nebulization Given 12/23/22 0730)  albuterol (PROVENTIL) (2.5 MG/3ML) 0.083% nebulizer solution 2.5 mg (2.5 mg  Nebulization Given 12/23/22 0729)  predniSONE (DELTASONE) tablet 40 mg (40 mg Oral Given 12/23/22 0744)    ED Course/ Medical Decision Making/ A&P Clinical Course as of 12/23/22 0816  Tue Dec 23, 2022  0813 Patient resting on bed and comfortable.  She denies any dyspnea or wheezing.  Heart rate is 80 with normal blood pressure and oxygen saturations at 100%. Lungs are clear to auscultation no wheezing is noted [DR]    Clinical Course User Index [DR] Margarita Grizzle, MD                                 Medical Decision Making Risk Prescription drug management.  32 year old female presents today complaining of wheezing and being out of albuterol Differential diagnosis includes but is not limited to asthma exacerbation, chronic stable asthma, acute lung infection including viral infections, pneumonia, PE, volume overload Patient with known history of asthma and out of albuterol and felt like she was having wheezing last night. Here she is not wheezing on my exam but had already received albuterol.  She has had symptoms relieved with albuterol which makes asthma a more likely diagnosis. Patient has no complaints at present. She has been given an albuterol inhaler and has a spacer at home She is given a prescription for prednisone He is advised regarding need for outpatient follow-up and return precautions and voices understanding         Final Clinical Impression(s) / ED Diagnoses Final diagnoses:  Moderate asthma with exacerbation, unspecified whether persistent  Has run out of medications    Rx / DC Orders ED Discharge Orders          Ordered    predniSONE (DELTASONE) 10 MG tablet  Daily        12/23/22 0737              Margarita Grizzle, MD 12/23/22 [phone]

## 2022-12-23 NOTE — Discharge Instructions (Signed)
Please continue your albuterol up to every 4 hours as needed with wheezing.  Please take prednisone.  Follow-up with your doctor in the next 1 to 3 days. Return to the emergency department if you are having any new symptoms or worsening symptoms

## 2022-12-31 IMAGING — US US THYROID
1 series · 14 of 25 positions shown · non-contrast
Comparison: None.

CLINICAL DATA: Thyromegaly on physical exam

EXAM:
THYROID ULTRASOUND
TECHNIQUE: Ultrasound examination of the thyroid gland and adjacent soft
tissues was performed.

[Series 1: us thyroid · 65 acquisitions, 14 frames shown]
[im 1/65]
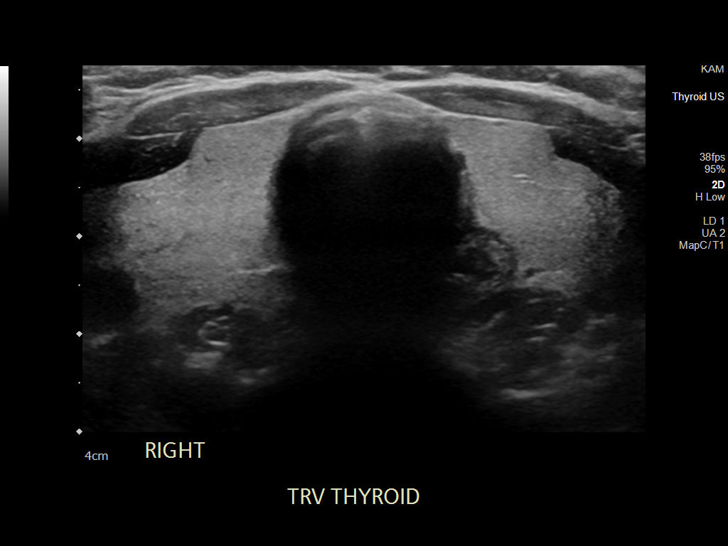
[im 6/65]
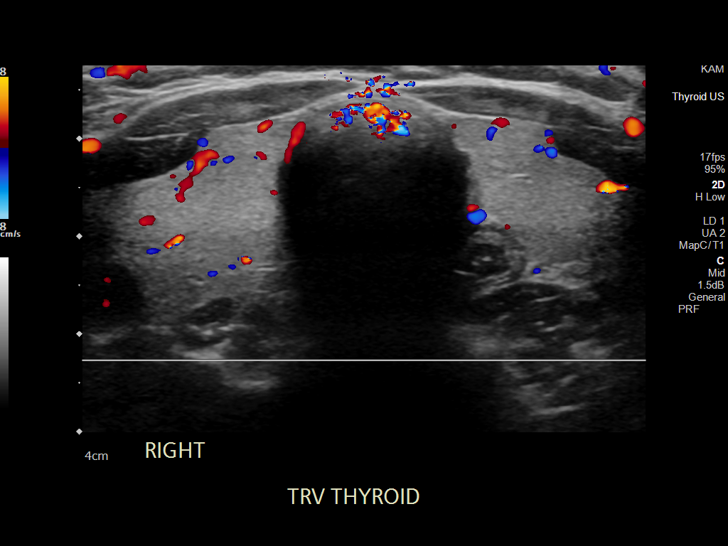
[im 11/65]
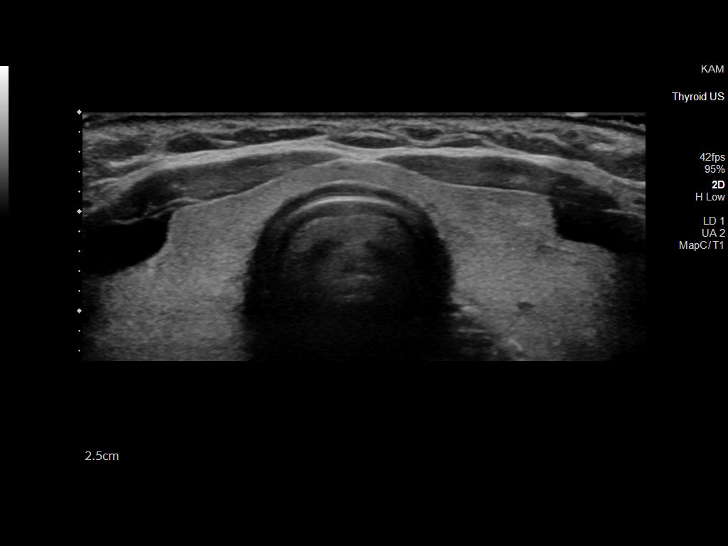
[im 17/65]
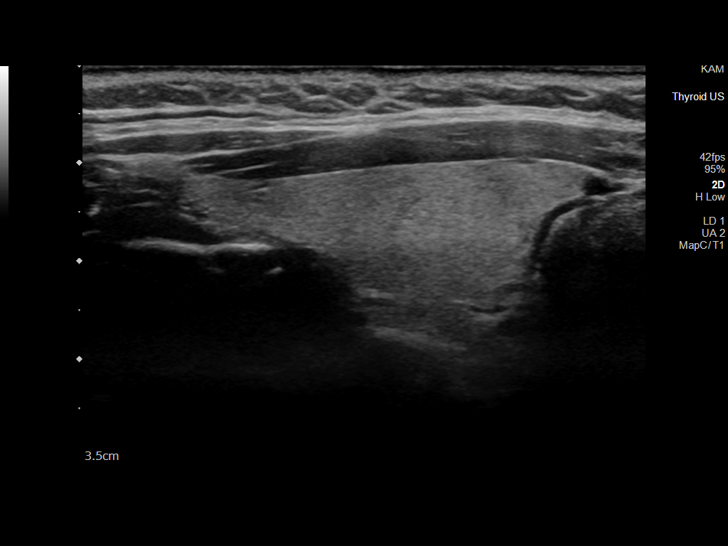
[im 22/65]
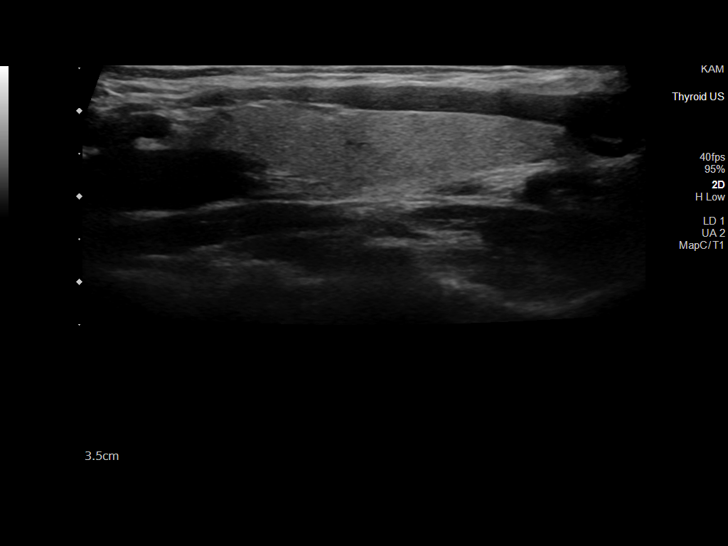
[im 25/65]
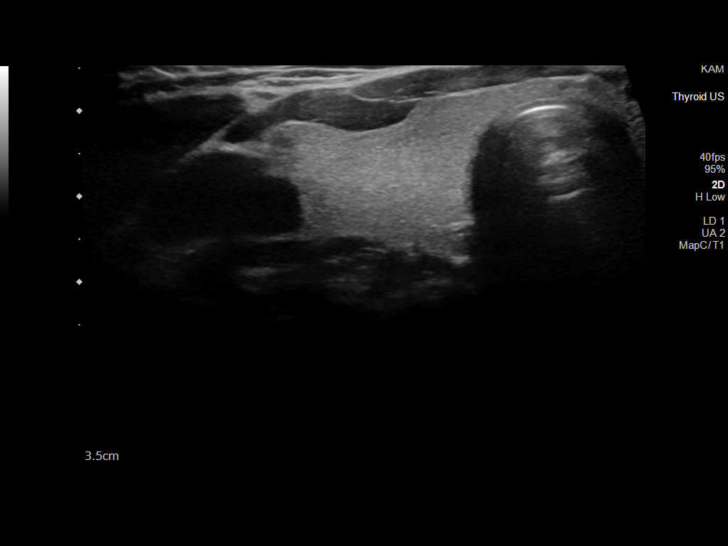
[im 30/65]
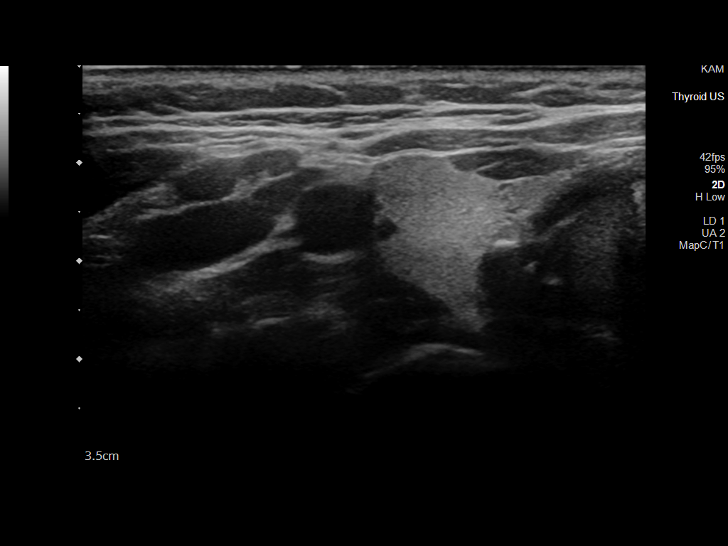
[im 35/65]
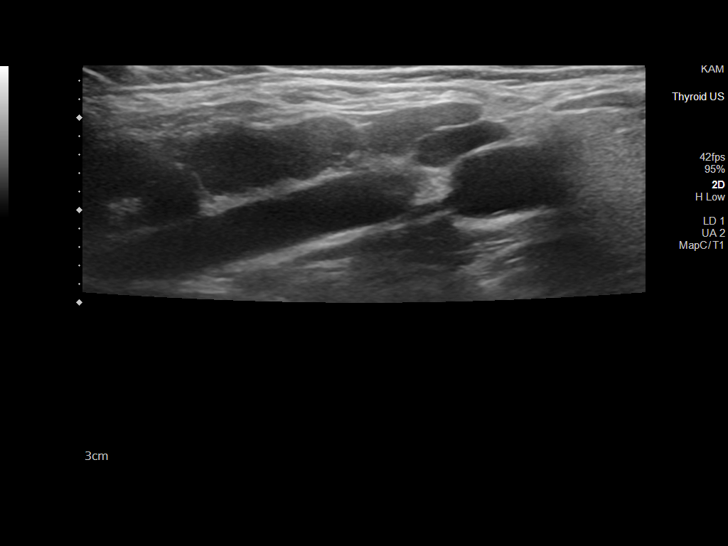
[im 41/65]
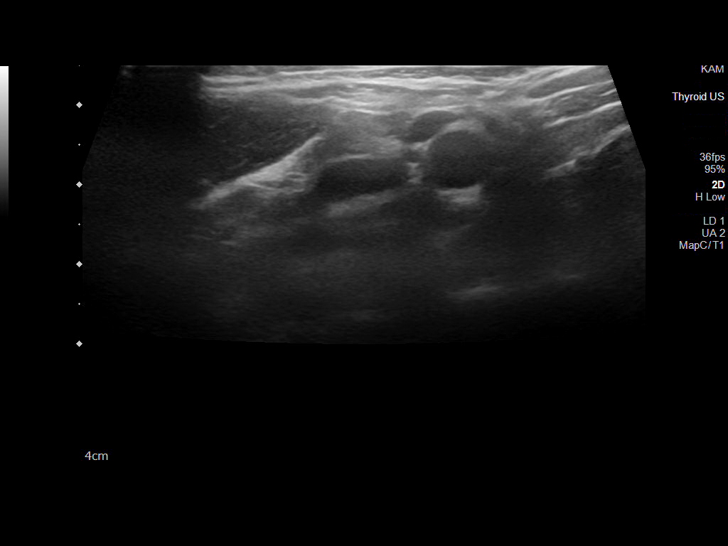
[im 43/65]
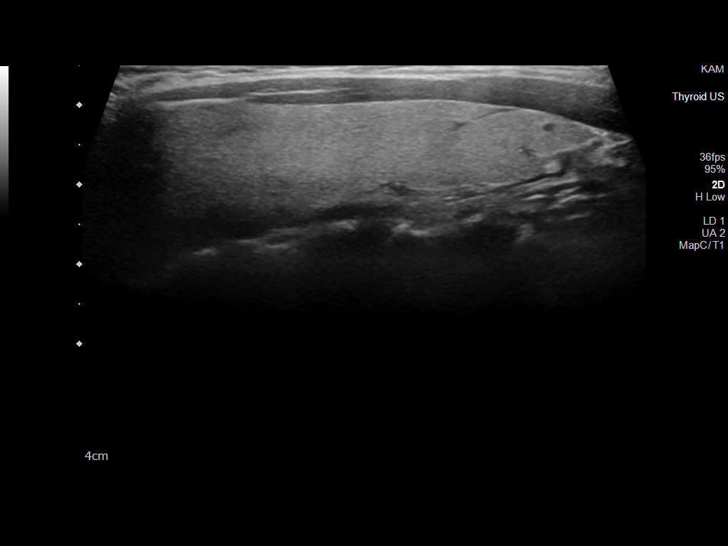
[im 49/65]
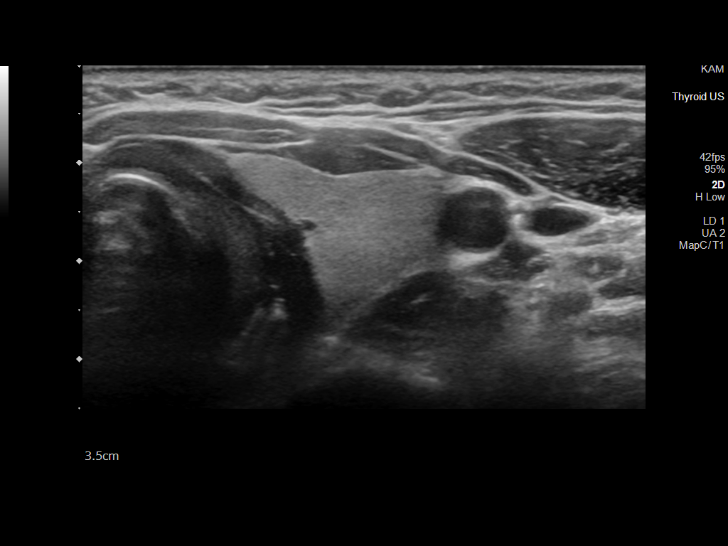
[im 54/65]
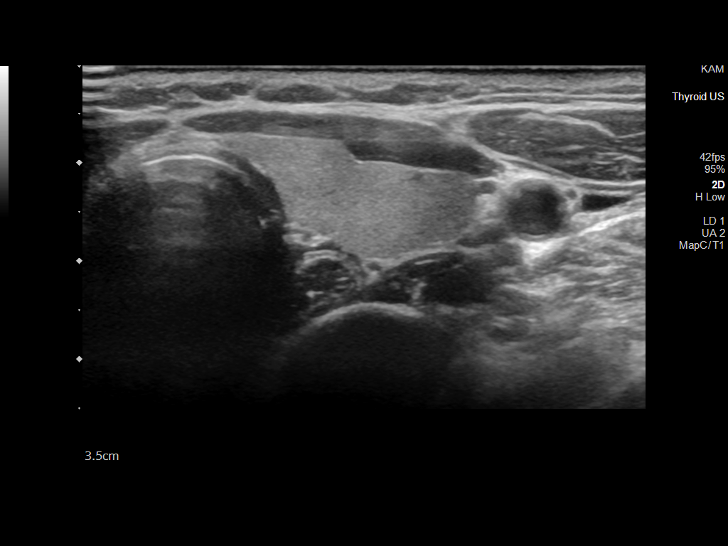
[im 59/65]
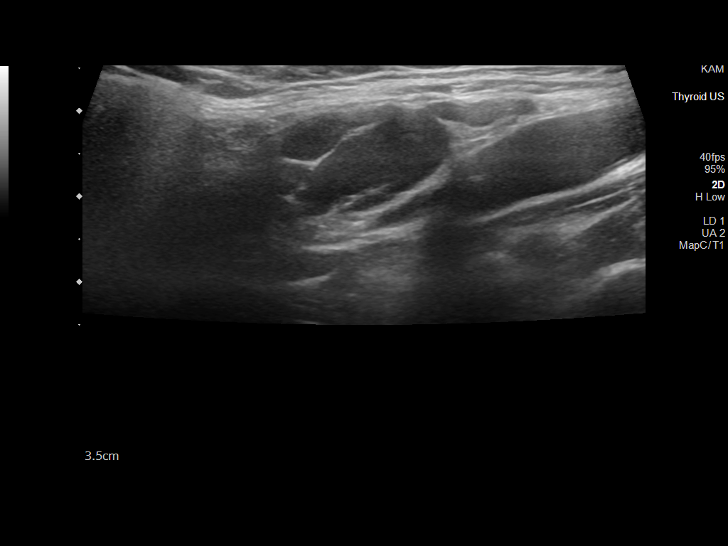
[im 65/65]
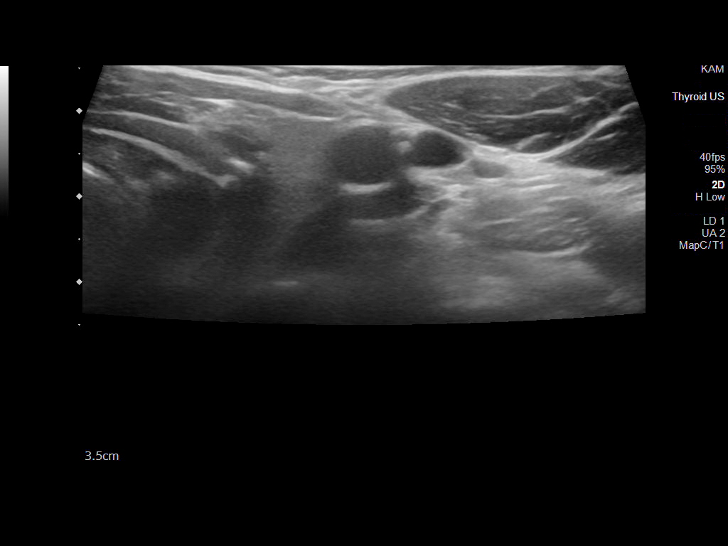

[14 of 25 positions shown; findings below may reference images not displayed]

FINDINGS: Parenchymal Echotexture: Normal

Isthmus: 0.3 cm thickness

Right lobe: 5.9 x 1.2 x 2.4 cm

Left lobe: 5.7 x 1.3 x 2.5 cm

_________________________________________________________

Estimated total number of nodules >/= 1 cm: 0

Number of spongiform nodules >/=  2 cm not described below (TR1): 0

Number of mixed cystic and solid nodules >/= 1.5 cm not described
below (TR2): 0

_________________________________________________________

Right cervical lymph nodes measuring up to 1 cm short axis diameter.
IMPRESSION: 1. Normal thyroid.
2. Borderline enlarged right cervical lymph node, possibly reactive
but nonspecific.

The above is in keeping with the ACR TI-RADS recommendations - [HOSPITAL] 4320;[DATE].

## 2023-01-17 ENCOUNTER — Other Ambulatory Visit: Payer: Self-pay

## 2023-01-17 ENCOUNTER — Emergency Department (HOSPITAL_BASED_OUTPATIENT_CLINIC_OR_DEPARTMENT_OTHER): Payer: 59 | Admitting: Radiology

## 2023-01-17 ENCOUNTER — Encounter (HOSPITAL_BASED_OUTPATIENT_CLINIC_OR_DEPARTMENT_OTHER): Payer: Self-pay | Admitting: Emergency Medicine

## 2023-01-17 ENCOUNTER — Emergency Department (HOSPITAL_BASED_OUTPATIENT_CLINIC_OR_DEPARTMENT_OTHER)
Admission: EM | Admit: 2023-01-17 | Discharge: 2023-01-17 | Disposition: A | Discharge status: Home / Self Care | Payer: 59 | Attending: Emergency Medicine | Admitting: Emergency Medicine

## 2023-01-17 DIAGNOSIS — R062 Wheezing: Secondary | ICD-10-CM | POA: Diagnosis present

## 2023-01-17 DIAGNOSIS — J4541 Moderate persistent asthma with (acute) exacerbation: Secondary | ICD-10-CM | POA: Insufficient documentation

## 2023-01-17 DIAGNOSIS — Z91010 Allergy to peanuts: Secondary | ICD-10-CM | POA: Diagnosis not present

## 2023-01-17 DIAGNOSIS — Z20822 Contact with and (suspected) exposure to covid-19: Secondary | ICD-10-CM | POA: Diagnosis not present

## 2023-01-17 LAB — COMPREHENSIVE METABOLIC PANEL
ALT: 12 U/L (ref 0–44)
AST: 33 U/L (ref 15–41)
Albumin: 4.6 g/dL (ref 3.5–5.0)
Alkaline Phosphatase: 76 U/L (ref 38–126)
Anion gap: 10 (ref 5–15)
BUN: 6 mg/dL (ref 6–20)
CO2: 23 mmol/L (ref 22–32)
Calcium: 10.2 mg/dL (ref 8.9–10.3)
Chloride: 103 mmol/L (ref 98–111)
Creatinine, Ser: 0.63 mg/dL (ref 0.44–1.00)
GFR, Estimated: >60 mL/min (ref >60)
Glucose, Bld: 76 mg/dL (ref 70–99)
Potassium: 3.6 mmol/L (ref 3.5–5.1)
Sodium: 136 mmol/L (ref 135–145)
Total Bilirubin: 0.3 mg/dL (ref <1.2)
Total Protein: 8.4 g/dL — ABNORMAL HIGH (ref 6.5–8.1)

## 2023-01-17 LAB — CBC WITH DIFFERENTIAL/PLATELET
Abs Immature Granulocytes: 0.02 10*3/uL (ref 0.00–0.07)
Basophils Absolute: 0 10*3/uL (ref 0.0–0.1)
Basophils Relative: 0 %
Eosinophils Absolute: 0 10*3/uL (ref 0.0–0.5)
Eosinophils Relative: 0 %
HCT: 36.2 % (ref 36.0–46.0)
Hemoglobin: 11.7 g/dL — ABNORMAL LOW (ref 12.0–15.0)
Immature Granulocytes: 0 %
Lymphocytes Relative: 35 %
Lymphs Abs: 3.4 10*3/uL (ref 0.7–4.0)
MCH: 26.5 pg (ref 26.0–34.0)
MCHC: 32.3 g/dL (ref 30.0–36.0)
MCV: 81.9 fL (ref 80.0–100.0)
Monocytes Absolute: 0.9 10*3/uL (ref 0.1–1.0)
Monocytes Relative: 9 %
Neutro Abs: 5.4 10*3/uL (ref 1.7–7.7)
Neutrophils Relative %: 56 %
Platelets: 405 10*3/uL — ABNORMAL HIGH (ref 150–400)
RBC: 4.42 MIL/uL (ref 3.87–5.11)
RDW: 15.1 % (ref 11.5–15.5)
WBC: 9.7 10*3/uL (ref 4.0–10.5)
nRBC: 0 % (ref 0.0–0.2)

## 2023-01-17 LAB — RESP PANEL BY RT-PCR (RSV, FLU A&B, COVID)  RVPGX2
Influenza A by PCR: NEGATIVE
Influenza B by PCR: NEGATIVE
Resp Syncytial Virus by PCR: NEGATIVE
SARS Coronavirus 2 by RT PCR: NEGATIVE

## 2023-01-17 LAB — TROPONIN I (HIGH SENSITIVITY): Troponin I (High Sensitivity): <2 ng/L (ref <18)

## 2023-01-17 LAB — HCG, SERUM, QUALITATIVE: Preg, Serum: NEGATIVE

## 2023-01-17 MED ORDER — IPRATROPIUM-ALBUTEROL 20-100 MCG/ACT IN AERS
1.0000 | INHALATION_SPRAY | Freq: Four times a day (QID) | RESPIRATORY_TRACT | Status: DC | PRN
  Administered 2023-01-17: 1 via RESPIRATORY_TRACT
  Filled 2023-01-17: qty 4

## 2023-01-17 MED ORDER — ALBUTEROL SULFATE HFA 108 (90 BASE) MCG/ACT IN AERS: 1.0000 | INHALATION_SPRAY | Freq: Four times a day (QID) | RESPIRATORY_TRACT | 1 refills | Status: DC | PRN

## 2023-01-17 MED ORDER — PREDNISONE 20 MG PO TABS: 40.0000 mg | ORAL_TABLET | Freq: Every day | ORAL | 0 refills | Status: DC

## 2023-01-17 MED ORDER — IPRATROPIUM-ALBUTEROL 0.5-2.5 (3) MG/3ML IN SOLN
3.0000 mL | Freq: Once | RESPIRATORY_TRACT | Status: AC
  Administered 2023-01-17: 3 mL via RESPIRATORY_TRACT
  Filled 2023-01-17: qty 3

## 2023-01-17 MED ORDER — ALBUTEROL SULFATE (2.5 MG/3ML) 0.083% IN NEBU
2.5000 mg | INHALATION_SOLUTION | Freq: Once | RESPIRATORY_TRACT | Status: AC
  Administered 2023-01-17: 2.5 mg via RESPIRATORY_TRACT
  Filled 2023-01-17: qty 3

## 2023-01-17 MED ORDER — ALBUTEROL SULFATE HFA 108 (90 BASE) MCG/ACT IN AERS
1.0000 | INHALATION_SPRAY | Freq: Four times a day (QID) | RESPIRATORY_TRACT | 1 refills | Status: DC | PRN
Start: 2023-01-17 — End: 2023-01-17

## 2023-01-17 MED ORDER — BUDESONIDE-FORMOTEROL FUMARATE 80-4.5 MCG/ACT IN AERO
2.0000 | INHALATION_SPRAY | Freq: Two times a day (BID) | RESPIRATORY_TRACT | 2 refills | Status: DC
Start: 2023-01-17 — End: 2023-05-29

## 2023-01-17 MED ORDER — METHYLPREDNISOLONE SODIUM SUCC 125 MG IJ SOLR
125.0000 mg | Freq: Once | INTRAMUSCULAR | Status: AC
  Administered 2023-01-17: 125 mg via INTRAVENOUS
  Filled 2023-01-17: qty 2

## 2023-01-17 MED ORDER — PREDNISONE 20 MG PO TABS: 40.0000 mg | ORAL_TABLET | Freq: Once | ORAL | Status: DC

## 2023-01-17 MED ORDER — BUDESONIDE-FORMOTEROL FUMARATE 80-4.5 MCG/ACT IN AERO: 2.0000 | INHALATION_SPRAY | Freq: Two times a day (BID) | RESPIRATORY_TRACT | 2 refills | Status: DC

## 2023-01-17 NOTE — ED Notes (Signed)
Reviewed discharge instructions, medications, and home care with pt. Pt verbalized understanding and had no further questions. Pt exited ED without complications.

## 2023-01-17 NOTE — Discharge Instructions (Addendum)
You were seen in the ER today for evaluation of your asthma.  I am glad that you are feeling better.  I am going to prescribe you a daily inhaler to use called Symbicort.  You will do 2 puffs twice a day.  The albuterol inhaler is only be used as needed as a rescue inhaler.  There is also been a refill of this sent.  Additionally, going to send you home with some prednisone which will take daily for the next 5 days.  This will help with your asthma exacerbation.  Please take as directed and complete the entirety of the course.  Additionally, please make sure you follow with your primary care provider as you will need to follow-up for your asthma control symptoms.  Have also included information for pulmonologist into the discharge report for you to call to schedule an appointment with.  If you have any concerns, new or worsening symptoms, please return to your nearest Emergency Department for evaluation.  Contact a doctor if: You have wheezing, shortness of breath, or a cough even while taking medicine to prevent attacks. The mucus you cough up (sputum) is thicker than usual. The mucus you cough up changes from clear or white to yellow, green, gray, or is bloody. You have problems from the medicine you are taking, such as: A rash. Itching. Swelling. Trouble breathing. You need reliever medicines more than 2-3 times a week. Your peak flow reading is still at 50-79% of your personal best after following the action plan for 1 hour. You have a fever. Get help right away if: You seem to be worse and are not responding to medicine during an asthma attack. You are short of breath even at rest. You get short of breath when doing very little activity. You have trouble eating, drinking, or talking. You have chest pain or tightness. You have a fast heartbeat. Your lips or fingernails start to turn blue. You are light-headed or dizzy, or you faint. Your peak flow is less than 50% of your personal  best. You feel too tired to breathe normally. These symptoms may be an emergency. Get help right away. Call 911. Do not wait to see if the symptoms will go away. Do not drive yourself to the hospital.

## 2023-01-17 NOTE — ED Notes (Signed)
RT educated pt on smoking secession as well as proper use of Respimat Combivent inhaler. Pt able to teach back and perform w/out difficulty. Pt verbalizes understanding of teaching.     01/17/23 1948  Aerosol Therapy Tx  $ Hand Held Nebulizer  1  Medications Combivent  Delivery Device MDI  Pre-Treatment Pulse 97  Pre-Treatment Respirations 15  Treatment Tolerance Tolerated well  Treatment Given 1  Oxygen Therapy/Pulse Ox  O2 Device Room Air  O2 Therapy Room air  FiO2 (%) 21 %

## 2023-01-17 NOTE — ED Notes (Signed)
RT ambulated pt w/pulse ox. Pt sats remained at 96% throughout ambulation. Pt respiratory status stable on RA w/no distress noted.     01/17/23 2056  Vitals  Pulse Rate (!) 116  ECG Heart Rate (!) 110  Resp 17  Respiratory Pattern Regular;Unlabored;Symmetrical  SpO2 96 %  Oxygen Therapy  O2 Device Room Air  FiO2 (%) 21 %  Patient Activity (if Appropriate) Ambulating  Pulse Oximetry Type Continuous

## 2023-01-17 NOTE — ED Triage Notes (Signed)
Pt has had several asthma attacks today ,is out of her inhaler and wanted to come in before it got worse. No fevers,.

## 2023-01-17 NOTE — ED Provider Notes (Signed)
EMERGENCY DEPARTMENT AT [facility] Provider Note   CSN: [id number] Arrival date & time: 01/17/23  1832     History {Add pertinent medical, surgical, social history, OB history to HPI:1} No chief complaint on file.   Connie Payne is a 32 y.o. female.  HPI     Home Medications Prior to Admission medications   Medication Sig Start Date End Date Taking? Authorizing Provider  albuterol (VENTOLIN HFA) 108 (90 Base) MCG/ACT inhaler Inhale 1-2 puffs into the lungs every 6 (six) hours as needed for wheezing or shortness of breath. 07/16/22   Deeann Saint, MD  fexofenadine (ALLEGRA) 180 MG tablet Take 1 tablet (180 mg total) by mouth daily. 10/07/18   Deeann Saint, MD  fluocinonide cream (LIDEX) 0.05 % Apply 1 Application topically 2 (two) times daily. 11/19/22   Claude Manges, PA-C  fluticasone-salmeterol (ADVAIR HFA) 034-74 MCG/ACT inhaler Inhale 2 puffs into the lungs 2 (two) times daily. 10/07/18   Deeann Saint, MD  ibuprofen (ADVIL) 800 MG tablet Take 1 tablet (800 mg total) by mouth 3 (three) times daily. 07/03/19   Roxy Horseman, PA-C      Allergies    Peanuts [peanut oil] and Sulfa antibiotics    Review of Systems   Review of Systems  Physical Exam Updated Vital Signs BP (!) 135/91 (BP Location: Right Arm)   Pulse (!) 103   Temp 97.9 F (36.6 C)   Resp 20   Wt 97.5 kg   LMP 12/02/2022 (Approximate) Comment: having periods  SpO2 100%   BMI 33.67 kg/m  Physical Exam  ED Results / Procedures / Treatments   Labs (all labs ordered are listed, but only abnormal results are displayed) Labs Reviewed  CBC WITH DIFFERENTIAL/PLATELET - Abnormal; Notable for the following components:      Result Value   Hemoglobin 11.7 (*)    Platelets 405 (*)    All other components within normal limits  COMPREHENSIVE METABOLIC PANEL - Abnormal; Notable for the following components:   Total Protein 8.4 (*)    All other components within  normal limits  RESP PANEL BY RT-PCR (RSV, FLU A&B, COVID)  RVPGX2  HCG, SERUM, QUALITATIVE  TROPONIN I (HIGH SENSITIVITY)    EKG EKG Interpretation Date/Time:  Saturday January 17 2023 19:32:06 EST Ventricular Rate:  84 PR Interval:  136 QRS Duration:  102 QT Interval:  388 QTC Calculation: 459 R Axis:   -23  Text Interpretation: Sinus rhythm Borderline left axis deviation No significant change since last tracing Confirmed by Fulton Reek [phone]) on 01/17/2023 7:47:19 PM  Radiology DG Chest 2 View  Result Date: 01/17/2023 CLINICAL DATA:  Chest pain. EXAM: CHEST - 2 VIEW COMPARISON:  August 24, 2017. FINDINGS: The heart size and mediastinal contours are within normal limits. Both lungs are clear. The visualized skeletal structures are unremarkable. IMPRESSION: No active cardiopulmonary disease. Electronically Signed   By: Lupita Raider M.D.   On: 01/17/2023 19:54    Procedures Procedures  {Document cardiac monitor, telemetry assessment procedure when appropriate:1}  Medications Ordered in ED Medications  Ipratropium-Albuterol (COMBIVENT) respimat 1 puff (1 puff Inhalation Given 01/17/23 1917)  methylPREDNISolone sodium succinate (SOLU-MEDROL) 125 mg/2 mL injection 125 mg (has no administration in time range)  ipratropium-albuterol (DUONEB) 0.5-2.5 (3) MG/3ML nebulizer solution 3 mL (3 mLs Nebulization Given 01/17/23 1917)  albuterol (PROVENTIL) (2.5 MG/3ML) 0.083% nebulizer solution 2.5 mg (2.5 mg Nebulization Given 01/17/23 1917)    ED Course/ Medical Decision  Making/ A&P   {   Click here for ABCD2, HEART and other calculatorsREFRESH Note before signing :1}                              Medical Decision Making Amount and/or Complexity of Data Reviewed Labs: ordered. Radiology: ordered.  Risk Prescription drug management.   ***  {Document critical care time when appropriate:1} {Document review of labs and clinical decision tools ie heart score, Chads2Vasc2 etc:1}   {Document your independent review of radiology images, and any outside records:1} {Document your discussion with family members, caretakers, and with consultants:1} {Document social determinants of health affecting pt's care:1} {Document your decision making why or why not admission, treatments were needed:1} Final Clinical Impression(s) / ED Diagnoses Final diagnoses:  None    Rx / DC Orders ED Discharge Orders     None

## 2023-01-30 ENCOUNTER — Other Ambulatory Visit (HOSPITAL_COMMUNITY): Payer: Self-pay

## 2023-05-29 ENCOUNTER — Telehealth: Admitting: Family Medicine

## 2023-05-29 ENCOUNTER — Encounter: Payer: Self-pay | Admitting: Family Medicine

## 2023-05-29 DIAGNOSIS — J302 Other seasonal allergic rhinitis: Secondary | ICD-10-CM | POA: Diagnosis not present

## 2023-05-29 DIAGNOSIS — L0100 Impetigo, unspecified: Secondary | ICD-10-CM | POA: Diagnosis not present

## 2023-05-29 DIAGNOSIS — J4540 Moderate persistent asthma, uncomplicated: Secondary | ICD-10-CM | POA: Diagnosis not present

## 2023-05-29 DIAGNOSIS — L309 Dermatitis, unspecified: Secondary | ICD-10-CM | POA: Diagnosis not present

## 2023-05-29 MED ORDER — MUPIROCIN 2 % EX OINT: 1.0000 | TOPICAL_OINTMENT | Freq: Two times a day (BID) | CUTANEOUS | 0 refills | Status: AC

## 2023-05-29 MED ORDER — FLUOCINONIDE 0.05 % EX CREA: 1.0000 | TOPICAL_CREAM | Freq: Two times a day (BID) | CUTANEOUS | 1 refills | Status: AC

## 2023-05-29 MED ORDER — BUDESONIDE-FORMOTEROL FUMARATE 80-4.5 MCG/ACT IN AERO: 2.0000 | INHALATION_SPRAY | Freq: Two times a day (BID) | RESPIRATORY_TRACT | 2 refills | Status: AC

## 2023-05-29 MED ORDER — ALBUTEROL SULFATE HFA 108 (90 BASE) MCG/ACT IN AERS: 1.0000 | INHALATION_SPRAY | Freq: Four times a day (QID) | RESPIRATORY_TRACT | 3 refills | Status: AC | PRN

## 2023-05-29 MED ORDER — PREDNISONE 10 MG PO TABS
ORAL_TABLET | ORAL | 0 refills | Status: AC
Start: 2023-05-29 — End: ?

## 2023-05-29 MED ORDER — FEXOFENADINE HCL 180 MG PO TABS: 180.0000 mg | ORAL_TABLET | Freq: Every day | ORAL | 0 refills | Status: DC

## 2023-05-29 NOTE — Progress Notes (Signed)
 Virtual Visit via Video Note  I connected with Connie Payne on 05/29/23 at  2:15 PM EDT by a video enabled telemedicine application and verified that I am speaking with the correct person using two identifiers.  Location patient: home Location provider:work or home office Persons participating in the virtual visit: patient, provider  I discussed the limitations of evaluation and management by telemedicine and the availability of in person appointments. The patient expressed understanding and agreed to proceed.  Chief Complaint  Patient presents with   Eczema    Due to allergies. Gradual over weeks    HPI: Pt is a 33 yo female seen for ongoing concerns.  Pt states face started breaking out when the pollen started.  Eyes are swollen and skin is darker on face. Yellow crusted area on R corner of mouth  Dry patches on back of hands Patient tried peroxide and moisturizer on face.  Also tried triamcinolone 0.1% cream from her grandmother.  Pt endorses sneezing, throat irritation, itchy eyes due to allergies.  Pt endorses increased stress since last OFV lost job at [employer] and the one at [employer].  Had to move out of apt.  Now living with grandparents. Didn't have insurance in the process.  ROS: See pertinent positives and negatives per HPI.  Past Medical History:  Diagnosis Date   Allergy    Asthma    Eczema     Past Surgical History:  Procedure Laterality Date   MOUTH SURGERY     WISDOM TOOTH EXTRACTION      Family History  Problem Relation Age of Onset   Breast cancer Maternal Grandfather    Hypertension Maternal Grandfather      Current Outpatient Medications:    ibuprofen (ADVIL) 800 MG tablet, Take 1 tablet (800 mg total) by mouth 3 (three) times daily., Disp: 21 tablet, Rfl: 0   mupirocin ointment (BACTROBAN) 2 %, Apply 1 Application topically 2 (two) times daily., Disp: 22 g, Rfl: 0   predniSONE (DELTASONE) 10 MG tablet, Take 4 tabs every morning for  3 days, 3 tabs for 2 days, 2 tabs for 2 days, 1 tab for 1 day., Disp: 23 tablet, Rfl: 0   albuterol (VENTOLIN HFA) 108 (90 Base) MCG/ACT inhaler, Inhale 1-2 puffs into the lungs every 6 (six) hours as needed for wheezing or shortness of breath., Disp: 8 g, Rfl: 3   budesonide-formoterol (SYMBICORT) 80-4.5 MCG/ACT inhaler, Inhale 2 puffs into the lungs in the morning and at bedtime., Disp: 30.6 g, Rfl: 2   fexofenadine (ALLEGRA) 180 MG tablet, Take 1 tablet (180 mg total) by mouth daily., Disp: 90 tablet, Rfl: 0   fluocinonide cream (LIDEX) 0.05 %, Apply 1 Application topically 2 (two) times daily., Disp: 60 g, Rfl: 1  Current Facility-Administered Medications:    fluocinonide cream (LIDEX) 0.05 %, , Topical, Daily,   EXAM:  VITALS per patient if applicable:  RR between 12-20 bpm.  GENERAL: alert, oriented, appears well and in no acute distress  HEENT: atraumatic, conjunctiva clear, no obvious abnormalities on inspection of external nose and ears  NECK: normal movements of the head and neck  LUNGS: on inspection no signs of respiratory distress, breathing rate appears normal, no obvious gross SOB, gasping or wheezing  CV: no obvious cyanosis  Skin: Hyperpigmented plaques on dorsum of bilateral hands, chest, neck, surrounding eyes, and on cheeks.  Right corner of mouth with yellow crusted lesion.  Difficult to fully appreciate due to lighting.  MS: moves all  visible extremities without noticeable abnormality  PSYCH/NEURO: pleasant and cooperative, no obvious depression or anxiety, speech and thought processing grossly intact  ASSESSMENT AND PLAN:  Discussed the following assessment and plan:  Impetigo - Plan: mupirocin ointment (BACTROBAN) 2 %  Eczema, unspecified type - Plan: predniSONE (DELTASONE) 10 MG tablet, fluocinonide cream (LIDEX) 0.05 %  Seasonal allergies - Plan: fexofenadine (ALLEGRA) 180 MG tablet  Moderate persistent asthma without complication - Plan: albuterol  (VENTOLIN HFA) 108 (90 Base) MCG/ACT inhaler, budesonide-formoterol (SYMBICORT) 80-4.5 MCG/ACT inhaler  Patient advised to restart allergy medications and take consistently.  Discussed using gentle soaps, lotions, detergents.  Changing air filters in home every 3 months.  Avoid using triamcinolone on face for eczema.  Given degree of eczema flare prednisone taper given.  Albuterol and Symbicort inhalers refilled.  I discussed the assessment and treatment plan with the patient. The patient was provided an opportunity to ask questions and all were answered. The patient agreed with the plan and demonstrated an understanding of the instructions.   The patient was advised to call back or seek an in-person evaluation if the symptoms worsen or if the condition fails to improve as anticipated.   Deeann Saint, MD

## 2023-05-29 NOTE — Progress Notes (Signed)
 Pt was not able to obtain vital signs due to lack of equipment at home via video visit.

## 2023-10-08 ENCOUNTER — Encounter: Admitting: Family Medicine

## 2023-10-22 ENCOUNTER — Encounter: Payer: Self-pay | Admitting: Family Medicine

## 2023-10-22 ENCOUNTER — Ambulatory Visit (INDEPENDENT_AMBULATORY_CARE_PROVIDER_SITE_OTHER): Admitting: Family Medicine

## 2023-10-22 VITALS — BP 118/76 | HR 95 | Temp 98.9°F | Ht 67.0 in | Wt 236.4 lb

## 2023-10-22 DIAGNOSIS — J302 Other seasonal allergic rhinitis: Secondary | ICD-10-CM | POA: Diagnosis not present

## 2023-10-22 DIAGNOSIS — E049 Nontoxic goiter, unspecified: Secondary | ICD-10-CM

## 2023-10-22 DIAGNOSIS — Z0000 Encounter for general adult medical examination without abnormal findings: Secondary | ICD-10-CM

## 2023-10-22 DIAGNOSIS — L2084 Intrinsic (allergic) eczema: Secondary | ICD-10-CM | POA: Diagnosis not present

## 2023-10-22 DIAGNOSIS — Z113 Encounter for screening for infections with a predominantly sexual mode of transmission: Secondary | ICD-10-CM

## 2023-10-22 LAB — COMPREHENSIVE METABOLIC PANEL WITH GFR
ALT: 16 U/L (ref 0–35)
AST: 36 U/L (ref 0–37)
Albumin: 4.5 g/dL (ref 3.5–5.2)
Alkaline Phosphatase: 81 U/L (ref 39–117)
BUN: 5 mg/dL — ABNORMAL LOW (ref 6–23)
CO2: 26 meq/L (ref 19–32)
Calcium: 9.3 mg/dL (ref 8.4–10.5)
Chloride: 105 meq/L (ref 96–112)
Creatinine, Ser: 0.63 mg/dL (ref 0.40–1.20)
GFR: 115.94 mL/min (ref >60.00)
Glucose, Bld: 86 mg/dL (ref 70–99)
Potassium: 4 meq/L (ref 3.5–5.1)
Sodium: 137 meq/L (ref 135–145)
Total Bilirubin: 0.2 mg/dL (ref 0.2–1.2)
Total Protein: 7.9 g/dL (ref 6.0–8.3)

## 2023-10-22 LAB — TSH: TSH: 1.82 u[IU]/mL (ref 0.35–5.50)

## 2023-10-22 LAB — CBC WITH DIFFERENTIAL/PLATELET
Basophils Absolute: 0 K/uL (ref 0.0–0.1)
Basophils Relative: 0.5 % (ref 0.0–3.0)
Eosinophils Absolute: 0.3 K/uL (ref 0.0–0.7)
Eosinophils Relative: 5.5 % — ABNORMAL HIGH (ref 0.0–5.0)
HCT: 35.3 % — ABNORMAL LOW (ref 36.0–46.0)
Hemoglobin: 11.4 g/dL — ABNORMAL LOW (ref 12.0–15.0)
Lymphocytes Relative: 48.7 % — ABNORMAL HIGH (ref 12.0–46.0)
Lymphs Abs: 2.2 K/uL (ref 0.7–4.0)
MCHC: 32.3 g/dL (ref 30.0–36.0)
MCV: 80.5 fl (ref 78.0–100.0)
Monocytes Absolute: 0.5 K/uL (ref 0.1–1.0)
Monocytes Relative: 11.3 % (ref 3.0–12.0)
Neutro Abs: 1.6 K/uL (ref 1.4–7.7)
Neutrophils Relative %: 34 % — ABNORMAL LOW (ref 43.0–77.0)
Platelets: 363 K/uL (ref 150.0–400.0)
RBC: 4.39 Mil/uL (ref 3.87–5.11)
RDW: 16.2 % — ABNORMAL HIGH (ref 11.5–15.5)
WBC: 4.6 K/uL (ref 4.0–10.5)

## 2023-10-22 LAB — LIPID PANEL
Cholesterol: 163 mg/dL (ref 0–200)
HDL: 50 mg/dL (ref >39.00)
LDL Cholesterol: 104 mg/dL — ABNORMAL HIGH (ref 0–99)
NonHDL: 112.92
Total CHOL/HDL Ratio: 3
Triglycerides: 43 mg/dL (ref 0.0–149.0)
VLDL: 8.6 mg/dL (ref 0.0–40.0)

## 2023-10-22 LAB — VITAMIN D 25 HYDROXY (VIT D DEFICIENCY, FRACTURES): VITD: 12.67 ng/mL — ABNORMAL LOW (ref 30.00–100.00)

## 2023-10-22 LAB — HEMOGLOBIN A1C: Hgb A1c MFr Bld: 5.8 % (ref 4.6–6.5)

## 2023-10-22 LAB — VITAMIN B12: Vitamin B-12: 294 pg/mL (ref 211–911)

## 2023-10-22 LAB — T3, FREE: T3, Free: 3.3 pg/mL (ref 2.3–4.2)

## 2023-10-22 LAB — T4, FREE: Free T4: 1 ng/dL (ref 0.60–1.60)

## 2023-10-22 MED ORDER — FEXOFENADINE HCL 180 MG PO TABS: 180.0000 mg | ORAL_TABLET | Freq: Every day | ORAL | 1 refills | Status: AC

## 2023-10-22 NOTE — Progress Notes (Signed)
 Established Patient Office Visit   Subjective  Patient ID: Connie Payne, female    DOB: [birth year]  Age: 33 y.o. MRN: [id number]  Chief Complaint  Patient presents with   Annual Exam    Pt is a 33 yo female seen for CPE and f/u.  Pt feels like she is taking work home and is under increased stress as she is helping her grandparents.  Pt currently staying with her grandparents and caring for her GF who recently suffered a spinal cord injury after a tree fell on him.    Pt considering restarting Dupixent for eczema or one of the new biological agents.     Patient Active Problem List   Diagnosis Date Noted   Rhinitis, allergic 05/04/2015   Asthma, moderate persistent 05/04/2015   Tobacco use disorder 05/04/2015   Eczema 05/04/2015   Past Medical History:  Diagnosis Date   Allergy    Asthma    Eczema    Past Surgical History:  Procedure Laterality Date   MOUTH SURGERY     WISDOM TOOTH EXTRACTION     Social History   Tobacco Use   Smoking status: Some Days    Current packs/day: 0.25    Average packs/day: 0.3 packs/day for 3.0 years (0.8 ttl pk-yrs)    Types: Cigarettes   Smokeless tobacco: Never   Tobacco comments:    Pt stated she smoked intermittently for 3 years.   Vaping Use   Vaping status: Some Days  Substance Use Topics   Alcohol use: Yes    Alcohol/week: 0.0 standard drinks of alcohol    Comment: occ, 2-3 drinks    Drug use: No   Family History  Problem Relation Age of Onset   Breast cancer Maternal Grandfather    Hypertension Maternal Grandfather    Allergies  Allergen Reactions   Peanuts [Peanut Oil] Anaphylaxis, Hives and Itching   Sulfa Antibiotics Nausea And Vomiting    ROS Negative unless stated above    Objective:     BP 118/76 (BP Location: Right Arm, Patient Position: Sitting, Cuff Size: Large)   Pulse 95   Temp 98.9 F (37.2 C) (Oral)   Ht 5' 7 (1.702 m)   Wt 236 lb 6.4 oz (107.2 kg)   LMP 10/06/2023  (Approximate)   SpO2 100%   BMI 37.03 kg/m  BP Readings from Last 3 Encounters:  10/22/23 118/76  01/17/23 121/73  12/23/22 104/64   Wt Readings from Last 3 Encounters:  10/22/23 236 lb 6.4 oz (107.2 kg)  01/17/23 215 lb (97.5 kg)  12/23/22 224 lb (101.6 kg)      Physical Exam Constitutional:      General: She is not in acute distress.    Appearance: Normal appearance.  HENT:     Head: Normocephalic and atraumatic.     Right Ear: Tympanic membrane, ear canal and external ear normal.     Left Ear: Tympanic membrane, ear canal and external ear normal.     Nose: Nose normal.     Mouth/Throat:     Mouth: Mucous membranes are moist.     Pharynx: No oropharyngeal exudate or posterior oropharyngeal erythema.  Eyes:     General: No scleral icterus.    Extraocular Movements: Extraocular movements intact.     Conjunctiva/sclera: Conjunctivae normal.     Pupils: Pupils are equal, round, and reactive to light.  Neck:     Thyroid: Thyromegaly present.     Vascular: No carotid bruit.  Cardiovascular:     Rate and Rhythm: Normal rate and regular rhythm.     Pulses: Normal pulses.     Heart sounds: Normal heart sounds. No murmur heard.    No friction rub. No gallop.  Pulmonary:     Effort: Pulmonary effort is normal. No respiratory distress.     Breath sounds: Normal breath sounds. No wheezing, rhonchi or rales.  Abdominal:     General: Bowel sounds are normal.     Palpations: Abdomen is soft.     Tenderness: There is no abdominal tenderness.  Musculoskeletal:        General: No deformity. Normal range of motion.  Lymphadenopathy:     Cervical: No cervical adenopathy.  Skin:    General: Skin is warm and dry.     Findings: No lesion.     Comments: Eczematous plaques with fissures in skin.  Neurological:     General: No focal deficit present.     Mental Status: She is alert and oriented to person, place, and time.  Psychiatric:        Mood and Affect: Mood normal.         Thought Content: Thought content normal.        10/22/2023   11:19 AM 05/29/2023    1:58 PM 02/28/2022    2:47 PM  Depression screen PHQ 2/9  Decreased Interest 1 2 2   Down, Depressed, Hopeless 1 3 1   PHQ - 2 Score 2 5 3   Altered sleeping 3 3 3   Tired, decreased energy 2 3 2   Change in appetite 2 3 3   Feeling bad or failure about yourself  0 1 1  Trouble concentrating 0 3 2  Moving slowly or fidgety/restless 0 3 1  Suicidal thoughts 0 0 0  PHQ-9 Score 9 21 15   Difficult doing work/chores   Somewhat difficult      10/22/2023   11:20 AM 05/29/2023    2:08 PM 11/01/2020    1:56 PM  GAD 7 : Generalized Anxiety Score  Nervous, Anxious, on Edge 3 1 2   Control/stop worrying 3 3 1   Worry too much - different things 1 3 2   Trouble relaxing 0 3 0  Restless 0 3 1  Easily annoyed or irritable 3 3 1   Afraid - awful might happen 0 0 1  Total GAD 7 Score 10 16 8   Anxiety Difficulty Somewhat difficult  Somewhat difficult     No results found for any visits on 10/22/23.    Assessment & Plan:   Well adult exam -     CBC with Differential/Platelet; Future -     Comprehensive metabolic panel with GFR; Future -     Hemoglobin A1c; Future -     Lipid panel; Future -     T4, free; Future -     TSH; Future -     Vitamin B12; Future -     VITAMIN D  25 Hydroxy (Vit-D Deficiency, Fractures); Future  Intrinsic eczema -     CBC with Differential/Platelet; Future -     Vitamin B12; Future -     VITAMIN D  25 Hydroxy (Vit-D Deficiency, Fractures); Future  Goiter -     T4, free; Future -     TSH; Future -     T3, free  Seasonal allergies -     Fexofenadine  HCl; Take 1 tablet (180 mg total) by mouth daily.  Dispense: 90 tablet; Refill: 1  Routine screening for STI (  sexually transmitted infection) -     HIV Antibody (routine testing w rflx); Future -     RPR -     C. trachomatis/N. gonorrhoeae RNA  Age appropriate health screenings discussed.  Obtain labs.  Immunizations reviewed.   Declined at this time.  Pap due.  F/u with Derm for Eczema/restarting Dupixent. Continue supportive care.  Allergra for seasonal allergies.  Self care encouraged.  Return if stress continues.   Clotilda JONELLE Single, MD

## 2023-10-23 LAB — HIV ANTIBODY (ROUTINE TESTING W REFLEX): HIV 1&2 Ab, 4th Generation: NONREACTIVE

## 2023-10-23 LAB — C. TRACHOMATIS/N. GONORRHOEAE RNA
C. trachomatis RNA, TMA: NOT DETECTED
N. gonorrhoeae RNA, TMA: NOT DETECTED

## 2023-10-23 LAB — RPR: RPR Ser Ql: NONREACTIVE

## 2023-10-26 ENCOUNTER — Ambulatory Visit: Payer: Self-pay | Admitting: Family Medicine

## 2023-10-26 DIAGNOSIS — E559 Vitamin D deficiency, unspecified: Secondary | ICD-10-CM

## 2023-10-26 MED ORDER — VITAMIN D (ERGOCALCIFEROL) 1.25 MG (50000 UNIT) PO CAPS: 50000.0000 [IU] | ORAL_CAPSULE | ORAL | 0 refills | Status: AC

## 2023-11-17 DIAGNOSIS — L2089 Other atopic dermatitis: Secondary | ICD-10-CM | POA: Diagnosis not present

## 2023-11-17 DIAGNOSIS — L209 Atopic dermatitis, unspecified: Secondary | ICD-10-CM | POA: Diagnosis not present

## 2024-01-19 DIAGNOSIS — L2089 Other atopic dermatitis: Secondary | ICD-10-CM | POA: Diagnosis not present
